# Patient Record
Sex: Female | Born: 2000
Health system: Southern US, Community
[De-identification: ages and names within clinical notes are randomized; demographics above are authoritative.]

## PROBLEM LIST (undated history)

## (undated) ENCOUNTER — Ambulatory Visit: Admission: EM | Payer: No Typology Code available for payment source | Source: Home / Self Care

## (undated) DIAGNOSIS — F329 Major depressive disorder, single episode, unspecified: Secondary | ICD-10-CM

## (undated) DIAGNOSIS — J45909 Unspecified asthma, uncomplicated: Secondary | ICD-10-CM

## (undated) DIAGNOSIS — R519 Headache, unspecified: Secondary | ICD-10-CM

## (undated) DIAGNOSIS — R51 Headache: Secondary | ICD-10-CM

## (undated) DIAGNOSIS — F32A Depression, unspecified: Secondary | ICD-10-CM

## (undated) DIAGNOSIS — L309 Dermatitis, unspecified: Secondary | ICD-10-CM

## (undated) DIAGNOSIS — F419 Anxiety disorder, unspecified: Secondary | ICD-10-CM

## (undated) DIAGNOSIS — J302 Other seasonal allergic rhinitis: Secondary | ICD-10-CM

## (undated) DIAGNOSIS — T7840XA Allergy, unspecified, initial encounter: Secondary | ICD-10-CM

## (undated) HISTORY — DX: Allergy, unspecified, initial encounter: T78.40XA

## (undated) HISTORY — DX: Depression, unspecified: F32.A

## (undated) HISTORY — PX: NO PAST SURGERIES: SHX2092

## (undated) HISTORY — DX: Major depressive disorder, single episode, unspecified: F32.9

## (undated) HISTORY — DX: Dermatitis, unspecified: L30.9

## (undated) HISTORY — DX: Headache, unspecified: R51.9

## (undated) HISTORY — DX: Anxiety disorder, unspecified: F41.9

## (undated) HISTORY — DX: Headache: R51

---
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
## 2019-01-02 MED FILL — BLISOVI FE 1/20 1-20 MG-MCG: 1-20 | 84 days supply | Qty: 112 | Fill #1

## 2019-01-17 ENCOUNTER — Telehealth: Payer: Self-pay | Admitting: Internal Medicine

## 2019-01-17 ENCOUNTER — Other Ambulatory Visit: Payer: Self-pay | Admitting: Internal Medicine

## 2019-01-17 DIAGNOSIS — F32A Depression, unspecified: Secondary | ICD-10-CM

## 2019-01-17 DIAGNOSIS — F419 Anxiety disorder, unspecified: Secondary | ICD-10-CM

## 2019-01-17 DIAGNOSIS — F329 Major depressive disorder, single episode, unspecified: Secondary | ICD-10-CM

## 2019-01-17 MED ORDER — FLUOXETINE HCL 10 MG PO CAPS: 10.0000 mg | ORAL_CAPSULE | Freq: Every day | ORAL | 3 refills | Status: DC

## 2019-01-17 MED FILL — FLUoxetine HCL 10 MG CAPS: 10 | 90 days supply | Qty: 90 | Fill #0

## 2019-01-17 NOTE — Telephone Encounter (Signed)
Change hep B vaccine 09/20/2018 to new vaccine please shes had 2 doses and does not need any further vaccines  TMS

## 2019-02-08 NOTE — Telephone Encounter (Signed)
I was able to correct this injection for patient. Heli-salv added.

## 2019-02-08 NOTE — Addendum Note (Signed)
Addended by: Nanci Pina on: 02/08/2019 04:57 PM   Modules accepted: Orders

## 2019-03-02 ENCOUNTER — Other Ambulatory Visit: Payer: Self-pay

## 2019-03-02 ENCOUNTER — Ambulatory Visit (INDEPENDENT_AMBULATORY_CARE_PROVIDER_SITE_OTHER): Payer: No Typology Code available for payment source | Admitting: Internal Medicine

## 2019-03-02 DIAGNOSIS — Z0000 Encounter for general adult medical examination without abnormal findings: Secondary | ICD-10-CM | POA: Diagnosis not present

## 2019-03-02 DIAGNOSIS — F419 Anxiety disorder, unspecified: Secondary | ICD-10-CM

## 2019-03-02 DIAGNOSIS — H01132 Eczematous dermatitis of right lower eyelid: Secondary | ICD-10-CM

## 2019-03-02 DIAGNOSIS — H01134 Eczematous dermatitis of left upper eyelid: Secondary | ICD-10-CM

## 2019-03-02 DIAGNOSIS — H01131 Eczematous dermatitis of right upper eyelid: Secondary | ICD-10-CM

## 2019-03-02 DIAGNOSIS — Z1389 Encounter for screening for other disorder: Secondary | ICD-10-CM

## 2019-03-02 DIAGNOSIS — E559 Vitamin D deficiency, unspecified: Secondary | ICD-10-CM

## 2019-03-02 DIAGNOSIS — Z0184 Encounter for antibody response examination: Secondary | ICD-10-CM

## 2019-03-02 DIAGNOSIS — Z1329 Encounter for screening for other suspected endocrine disorder: Secondary | ICD-10-CM

## 2019-03-02 DIAGNOSIS — Z1159 Encounter for screening for other viral diseases: Secondary | ICD-10-CM

## 2019-03-02 DIAGNOSIS — F321 Major depressive disorder, single episode, moderate: Secondary | ICD-10-CM

## 2019-03-02 DIAGNOSIS — L309 Dermatitis, unspecified: Secondary | ICD-10-CM

## 2019-03-02 DIAGNOSIS — H01135 Eczematous dermatitis of left lower eyelid: Secondary | ICD-10-CM

## 2019-03-02 NOTE — Progress Notes (Signed)
Virtual Visit via Video Note  I connected with Amanda Blackburn  on 03/03/19 at  4:20 PM EDT by a video enabled telemedicine application and verified that I am speaking with the correct person using two identifiers.  Location patient: home Location provider:work or home office Persons participating in the virtual visit: patient, provider  I discussed the limitations of evaluation and management by telemedicine and the availability of in person appointments. The patient expressed understanding and agreed to proceed.   HPI: 1. Anxiety and depression doing better taking prozac 10 mg consistently and let go of boyfriend and other few friends she did not feel aligned well with her or had the same goals. She has moved to her own apt and enjoying this. Also she feels she can speak with her mother more openly about life issues  2. Eczema to face and skin doing better  3. Allergic rhinitis atrovent is helping informed refills at her pharmacy  4. Annual   ROS: See pertinent positives and negatives per HPI. General: wt stable HEENT: nose sx's with allergies better can breath CV: no chest pain  Lungs: no sob  GI: no ab pain  GU: no issues MSK: no jt pain  Neuro: no h/a  Psych: improved anxiety/deression  Skin: eczema improved   Past Medical History:  Diagnosis Date  . Allergy   . Anxiety   . Asthma   . Depression    sees therapy   . Eczema   . Frequent headaches   . Seasonal allergies     Past Surgical History:  Procedure Laterality Date  . NO PAST SURGERIES      Family History  Problem Relation Age of Onset  . Diabetes Mother   . Asthma Mother   . Hypertension Father   . Asthma Sister     SOCIAL HX:  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   Single as of 03/02/2019  No guns  Wears seat belt Safe in relationship  No etoh or drugs  She wants to be a Engineer, manufacturing systems  Current Outpatient Medications:  .  albuterol (PROAIR HFA) 108 (90 Base) MCG/ACT inhaler, Inhale 2 puffs into the lungs  every 4 (four) hours as needed for wheezing or shortness of breath., Disp: 1 Inhaler, Rfl: 1 .  beclomethasone (QVAR) 40 MCG/ACT inhaler, Inhale 2 puffs into the lungs 2 (two) times daily., Disp: 1 Inhaler, Rfl: 5 .  Beclomethasone Diprop HFA (QVAR REDIHALER) 40 MCG/ACT AERB, Inhale 2 puffs into the lungs 2 (two) times daily., Disp: 1 Inhaler, Rfl: 3 .  budesonide (PULMICORT) 180 MCG/ACT inhaler, Inhale into the lungs as needed., Disp: , Rfl:  .  desonide (DESOWEN) 0.05 % cream, Apply topically 2 (two) times daily. To eyelids and under eyes as needed, Disp: 60 g, Rfl: 0 .  EPINEPHrine (EPIPEN 2-PAK) 0.3 mg/0.3 mL IJ SOAJ injection, Inject 0.3 mg into the muscle once., Disp: , Rfl:  .  FLUoxetine (PROZAC) 10 MG capsule, Take 1 capsule (10 mg total) by mouth daily., Disp: 90 capsule, Rfl: 3 .  fluticasone (FLONASE) 50 MCG/ACT nasal spray, Place 2 sprays into both nostrils daily., Disp: 16 g, Rfl: 0 .  fluticasone (FLOVENT HFA) 44 MCG/ACT inhaler, Inhale 2 puffs into the lungs 2 (two) times daily., Disp: 1 Inhaler, Rfl: 5 .  hydrocortisone 2.5 % cream, Apply topically 2 (two) times daily., Disp: 60 g, Rfl: 11 .  ibuprofen (ADVIL,MOTRIN) 400 MG tablet, Take 1 tablet (400 mg total) by mouth every 6 (six)  hours as needed., Disp: 30 tablet, Rfl: 0 .  ipratropium (ATROVENT) 0.06 % nasal spray, Place 2 sprays into both nostrils 4 (four) times daily., Disp: 15 mL, Rfl: 12 .  montelukast (SINGULAIR) 10 MG tablet, Take 1 tablet (10 mg total) by mouth at bedtime., Disp: 90 tablet, Rfl: 3 .  naproxen (NAPROSYN) 500 MG tablet, Take 1 tablet (500 mg total) by mouth 2 (two) times daily., Disp: 30 tablet, Rfl: 0 .  norethindrone-ethinyl estradiol (JUNEL FE,GILDESS FE,LOESTRIN FE) 1-20 MG-MCG tablet, Take 1 tablet by mouth daily. Only take active pills, in a continuous fashion., Disp: 3 Package, Rfl: 4 .  olopatadine (PATANOL) 0.1 % ophthalmic solution, PLACE 1 DROP INTO BOTH EYES DAILY AS NEEDED FOR ALLERGIES., Disp: 5  mL, Rfl: 0  EXAM:  VITALS per patient if applicable:  GENERAL: alert, oriented, appears well and in no acute distress  HEENT: atraumatic, conjunttiva clear, no obvious abnormalities on inspection of external nose and ears  NECK: normal movements of the head and neck  LUNGS: on inspection no signs of respiratory distress, breathing rate appears normal, no obvious gross SOB, gasping or wheezing  CV: no obvious cyanosis  MS: moves all visible extremities without noticeable abnormality  PSYCH/NEURO: pleasant and cooperative, no obvious depression or anxiety, speech and thought processing grossly intact  ASSESSMENT AND PLAN:  Discussed the following assessment and plan:  Annual physical exam Flu shot will need for 2020  Tdap had 08/18/10 will need in 10 years if not had  HPV vx had  Hep A/B vx had but updated 09/20/2018 and 09/2018 with new Hep B vaccine  -check hep B titer had 2/2 new hep B vaccine  Meningococcal vx had 01/14/18 MMR immune    Never had pap will do age 59  Disc safe sex practices on OCP See prior STD check Disc exercise to lose goal wt for now 130s given ht 5'3" rec responsible choices  No etoh or tobacco use   Labs we need to check in future 06/21/2019 CMET, CBC, TSH, T4, UA, vitamin D, hep B    Anxiety Current moderate episode of major depressive disorder without prior episode ([place]) -doing better cont meds prozac 10   Eczematous dermatitis of upper and lower eyelids of both eyes Eczema, unspecified type Doing better -disc mild skin care otc cephaphil/cerave products creams in winter for dry skin       I discussed the assessment and treatment plan with the patient. The patient was provided an opportunity to ask questions and all were answered. The patient agreed with the plan and demonstrated an understanding of the instructions.   The patient was advised to call back or seek an in-person evaluation if the symptoms worsen or if the condition fails  to improve as anticipated.  Time spent 15 minutes  Delorise Jackson, MD

## 2019-03-03 ENCOUNTER — Encounter: Payer: Self-pay | Admitting: Internal Medicine

## 2019-03-03 DIAGNOSIS — Z0000 Encounter for general adult medical examination without abnormal findings: Secondary | ICD-10-CM | POA: Insufficient documentation

## 2019-03-28 ENCOUNTER — Other Ambulatory Visit: Payer: No Typology Code available for payment source

## 2019-03-30 ENCOUNTER — Other Ambulatory Visit (HOSPITAL_COMMUNITY)
Admission: RE | Admit: 2019-03-30 | Discharge: 2019-03-30 | Disposition: A | Discharge status: Home / Self Care | Payer: No Typology Code available for payment source | Source: Ambulatory Visit | Attending: Internal Medicine | Admitting: Internal Medicine

## 2019-03-30 ENCOUNTER — Other Ambulatory Visit: Payer: Self-pay | Admitting: Internal Medicine

## 2019-03-30 ENCOUNTER — Other Ambulatory Visit: Payer: Self-pay

## 2019-03-30 ENCOUNTER — Other Ambulatory Visit (INDEPENDENT_AMBULATORY_CARE_PROVIDER_SITE_OTHER): Payer: No Typology Code available for payment source

## 2019-03-30 DIAGNOSIS — R300 Dysuria: Secondary | ICD-10-CM

## 2019-03-30 DIAGNOSIS — E559 Vitamin D deficiency, unspecified: Secondary | ICD-10-CM | POA: Diagnosis not present

## 2019-03-30 DIAGNOSIS — Z1329 Encounter for screening for other suspected endocrine disorder: Secondary | ICD-10-CM

## 2019-03-30 DIAGNOSIS — Z0184 Encounter for antibody response examination: Secondary | ICD-10-CM

## 2019-03-30 DIAGNOSIS — Z0000 Encounter for general adult medical examination without abnormal findings: Secondary | ICD-10-CM | POA: Diagnosis not present

## 2019-03-30 DIAGNOSIS — Z1389 Encounter for screening for other disorder: Secondary | ICD-10-CM

## 2019-03-31 LAB — CBC WITH DIFFERENTIAL/PLATELET
Basophils Absolute: 0 10*3/uL (ref 0.0–0.1)
Basophils Relative: 0.8 % (ref 0.0–3.0)
Eosinophils Absolute: 0.2 10*3/uL (ref 0.0–0.7)
Eosinophils Relative: 3.5 % (ref 0.0–5.0)
HCT: 40.8 % (ref 36.0–49.0)
Hemoglobin: 13.3 g/dL (ref 12.0–16.0)
Lymphocytes Relative: 35.3 % (ref 24.0–48.0)
Lymphs Abs: 2 10*3/uL (ref 0.7–4.0)
MCHC: 32.5 g/dL (ref 31.0–37.0)
MCV: 86.8 fl (ref 78.0–98.0)
Monocytes Absolute: 0.2 10*3/uL (ref 0.1–1.0)
Monocytes Relative: 4.1 % (ref 3.0–12.0)
Neutro Abs: 3.1 10*3/uL (ref 1.4–7.7)
Neutrophils Relative %: 56.3 % (ref 43.0–71.0)
Platelets: 290 10*3/uL (ref 150.0–575.0)
RBC: 4.7 Mil/uL (ref 3.80–5.70)
RDW: 12.8 % (ref 11.4–15.5)
WBC: 5.5 10*3/uL (ref 4.5–13.5)

## 2019-03-31 LAB — URINE CULTURE
MICRO NUMBER:: 944227
SPECIMEN QUALITY:: ADEQUATE

## 2019-03-31 LAB — COMPREHENSIVE METABOLIC PANEL
ALT: 11 U/L (ref 0–35)
AST: 14 U/L (ref 0–37)
Albumin: 3.9 g/dL (ref 3.5–5.2)
Alkaline Phosphatase: 52 U/L (ref 47–119)
BUN: 13 mg/dL (ref 6–23)
CO2: 26 mEq/L (ref 19–32)
Calcium: 9.3 mg/dL (ref 8.4–10.5)
Chloride: 104 mEq/L (ref 96–112)
Creatinine, Ser: 0.83 mg/dL (ref 0.40–1.20)
GFR: 106.41 mL/min (ref >60.00)
Glucose, Bld: 87 mg/dL (ref 70–99)
Potassium: 3.8 mEq/L (ref 3.5–5.1)
Sodium: 139 mEq/L (ref 135–145)
Total Bilirubin: 0.4 mg/dL (ref 0.2–1.2)
Total Protein: 7.3 g/dL (ref 6.0–8.3)

## 2019-03-31 LAB — URINALYSIS, ROUTINE W REFLEX MICROSCOPIC
Bilirubin Urine: NEGATIVE
Glucose, UA: NEGATIVE
Hgb urine dipstick: NEGATIVE
Ketones, ur: NEGATIVE
Leukocytes,Ua: NEGATIVE
Nitrite: NEGATIVE
Protein, ur: NEGATIVE
Specific Gravity, Urine: 1.025 (ref 1.001–1.03)
pH: 7 (ref 5.0–8.0)

## 2019-03-31 LAB — VITAMIN D 25 HYDROXY (VIT D DEFICIENCY, FRACTURES): VITD: 28.68 ng/mL — ABNORMAL LOW (ref 30.00–100.00)

## 2019-03-31 LAB — TSH: TSH: 0.84 u[IU]/mL (ref 0.40–5.00)

## 2019-03-31 LAB — HEPATITIS B SURFACE ANTIBODY, QUANTITATIVE: Hep B S AB Quant (Post): 361 m[IU]/mL (ref >=10)

## 2019-03-31 LAB — T4, FREE: Free T4: 0.78 ng/dL (ref 0.60–1.60)

## 2019-04-02 ENCOUNTER — Encounter: Payer: Self-pay | Admitting: Internal Medicine

## 2019-04-03 LAB — CERVICOVAGINAL ANCILLARY ONLY
Chlamydia: POSITIVE — AB
Neisseria Gonorrhea: NEGATIVE
Trichomonas: NEGATIVE

## 2019-04-04 ENCOUNTER — Encounter: Payer: Self-pay | Admitting: Internal Medicine

## 2019-04-04 ENCOUNTER — Other Ambulatory Visit: Payer: Self-pay | Admitting: Internal Medicine

## 2019-04-04 DIAGNOSIS — A749 Chlamydial infection, unspecified: Secondary | ICD-10-CM

## 2019-04-04 MED ORDER — AZITHROMYCIN 500 MG PO TABS: ORAL_TABLET | ORAL | 0 refills | Status: DC

## 2019-04-04 MED FILL — AZITHROMYCIN 500 MG TABS: 500 | 1 days supply | Qty: 2 | Fill #0

## 2019-04-05 LAB — URINE CYTOLOGY ANCILLARY ONLY
Bacterial vaginitis: POSITIVE — AB
Candida vaginitis: POSITIVE — AB

## 2019-04-06 ENCOUNTER — Other Ambulatory Visit: Payer: Self-pay | Admitting: Internal Medicine

## 2019-04-06 DIAGNOSIS — B9689 Other specified bacterial agents as the cause of diseases classified elsewhere: Secondary | ICD-10-CM

## 2019-04-06 DIAGNOSIS — B3731 Acute candidiasis of vulva and vagina: Secondary | ICD-10-CM

## 2019-04-06 DIAGNOSIS — B373 Candidiasis of vulva and vagina: Secondary | ICD-10-CM

## 2019-04-06 MED ORDER — METRONIDAZOLE 500 MG PO TABS: 500.0000 mg | ORAL_TABLET | Freq: Two times a day (BID) | ORAL | 0 refills | Status: DC

## 2019-04-06 MED ORDER — FLUCONAZOLE 150 MG PO TABS: 150.0000 mg | ORAL_TABLET | Freq: Once | ORAL | 0 refills | Status: AC

## 2019-04-06 MED FILL — METRONIDAZOLE 500 MG TABS: 500 | 7 days supply | Qty: 14 | Fill #0

## 2019-04-06 MED FILL — FLUCONAZOLE 150 MG TABS: 150 | 1 days supply | Qty: 1 | Fill #0

## 2019-04-13 ENCOUNTER — Telehealth: Payer: Self-pay | Admitting: *Deleted

## 2019-04-13 NOTE — Telephone Encounter (Signed)
I Have completed the proper form for reporting a Communicable disease to the [facility].

## 2019-04-13 NOTE — Telephone Encounter (Signed)
-----   Message from Delorise Jackson, MD sent at 04/04/2019  7:17 PM EDT ----- +chlamydia   Juliann Pulse please show brock how to call health dept if this is needed its reportable   Thanks Brewer

## 2019-04-17 MED FILL — MONTELUKAST SOD 10 MG TAB: 10 | 90 days supply | Qty: 90 | Fill #1

## 2019-04-17 MED FILL — FLUoxetine HCL 10 MG CAPS: 10 | 90 days supply | Qty: 90 | Fill #1

## 2019-04-20 NOTE — Progress Notes (Signed)
[address] [place] [address] Dept: [phone]  FOLLOW UP NOTE  Patient ID: Amanda Blackburn, female    DOB: 2000-08-06  Age: 18 y.o. MRN: [id number] Date of Office Visit: 04/21/2019  Assessment  Chief Complaint: Asthma and Allergic Rhinitis   HPI Amanda Blackburn is a 18 year old female sho presents to the clinic for a follow up visit. She was last seen in this clinic on 05/29/2016 by Dr. Delorse Lek for evaluation of asthma, allergic rhinitis,and allergic conjunctivitis. She reports her asthma has been well controlled with no shortness of breath or cough with activity or rest.  She does report wheezing when laughing.  She has previously taken montelukast, however she is currently out of this medication.  She reports using her ProAir about once a month with relief of symptoms.  Allergic rhinitis is reported is not well controlled with clear rhinorrhea and sneezing which occurs year-round with this worsening in the spring.  She denies nasal congestion and postnasal drainage.  She continues Flonase as needed and Xyzol almost every day with no relief of symptoms.  She is not currently using nasal saline rinses.  Allergic conjunctivitis is reported as well controlled with no current medical intervention.  She reports that her mouth itches when she eats fresh apples, kiwis, and peaches.  She denies concomitant cardiopulmonary, angioedema, or gastrointestinal symptoms with the itchy mouth on ingestion of raw fruits.  Her current medications are listed in the chart.   Drug Allergies:  Allergies  Allergen Reactions  . Nickel Rash    Rash   . Sulfa Antibiotics Rash    Rash     Physical Exam: BP 98/70 (BP Location: Left Arm, Patient Position: Sitting, Cuff Size: Normal)   Pulse 88   Temp 97.9 F (36.6 C) (Temporal)   Resp 16   Ht 5' 3.5" (1.613 m)   Wt 153 lb 12.8 oz (69.8 kg)   SpO2 97%   BMI 26.82 kg/m    Physical Exam Vitals signs reviewed.  Constitutional:      Appearance:  Normal appearance.  HENT:     Head: Normocephalic and atraumatic.     Right Ear: Tympanic membrane normal.     Left Ear: Tympanic membrane normal.     Nose:     Comments: Bilateral nares slightly erythematous with clear nasal drainage noted.  Pharynx slightly erythematous with no exudate noted.  Ears normal.  Eyes normal. Eyes:     Conjunctiva/sclera: Conjunctivae normal.  Neck:     Musculoskeletal: Normal range of motion and neck supple.  Cardiovascular:     Rate and Rhythm: Normal rate and regular rhythm.     Heart sounds: Normal heart sounds. No murmur.  Pulmonary:     Effort: Pulmonary effort is normal.     Breath sounds: Normal breath sounds.     Comments: Lungs clear to auscultation Musculoskeletal: Normal range of motion.  Skin:    General: Skin is warm and dry.  Neurological:     Mental Status: She is alert and oriented to person, place, and time.  Psychiatric:        Mood and Affect: Mood normal.        Behavior: Behavior normal.        Thought Content: Thought content normal.        Judgment: Judgment normal.     Diagnostics: FVC 3.45, FEV1 2.61.  Predicted FVC 3.19.  Predicted FEV1 2.84.  Spirometry indicates normal ventilatory function.  Percutaneous environmental skin  testing positive to grass pollen, weed pollen, and tree pollen with adequate controls.  Intradermal skin testing positive to ragweed pollen, mold mix 2, cat hair, and dust mite with adequate controls.    Assessment and Plan: 1. Mild intermittent asthma without complication   2. Seasonal and perennial allergic rhinitis   3. Pollen-food allergy, subsequent encounter     Meds ordered this encounter  Medications  . EPINEPHrine (AUVI-Q) 0.3 mg/0.3 mL IJ SOAJ injection    Sig: Inject 0.3 mLs (0.3 mg total) into the muscle once for 1 dose. As directed for life-threatening allergic reactions    Dispense:  2 each    Refill:  2  . EPINEPHrine (EPIPEN 2-PAK) 0.3 mg/0.3 mL IJ SOAJ injection    Sig:  Inject 0.3 mLs (0.3 mg total) into the muscle once for 1 dose.    Dispense:  0.3 mL    Refill:  0    Patient Instructions  Asthma Begin montelukast 10 mg once a day to prevent cough and wheeze Continue albuterol 2 puffs every 4 hours as needed for cough or wheeze Asthma control goals:   Full participation in all desired activities (may need albuterol before activity)  Albuterol use two time or less a week on average (not counting use with activity)  Cough interfering with sleep two time or less a month  Oral steroids no more than once a year  No hospitalizations   Allergic rhinitis You environmental skin testing was positive to grass pollen, weed, pollen, tree pollen, rag weed pollen, mold, dust mite, and cat hair Stop Xyzal and begin RyVent 6 mg twice a day Continue Flonase 1-2 sprays in each nostril once a day as needed for a stuffy nose Consider allergen immunotherapy if yourr symptoms are not well controlled with the above treatment plan. If you  Oral allergy syndrome Continue to avoid the foods that make your mouth itch The oral allergy syndrome (OAS) or pollen-food allergy syndrome (PFAS) is a relatively common form of food allergy, particularly in adults. It typically occurs in people who have pollen allergies when the immune system "sees" proteins on the food that look like proteins on the pollen. This results in the allergy antibody (IgE) binding to the food instead of the pollen. Patients typically report itching and/or mild swelling of the mouth and throat immediately following ingestion of certain uncooked fruits (including nuts) or raw vegetables. Only a very small number of affected individuals experience systemic allergic reactions, such as anaphylaxis which occurs with true food allergies.     Call the clinic if this treatment plan is not working well for you  Follow up in 6 weeks or sooner if needed   Return in about 6 weeks (around 06/02/2019).    Thank you  for the opportunity to care for this patient.  Please do not hesitate to contact me with questions.  Gareth Morgan, FNP [facility]

## 2019-04-21 ENCOUNTER — Other Ambulatory Visit: Payer: Self-pay

## 2019-04-21 ENCOUNTER — Ambulatory Visit (INDEPENDENT_AMBULATORY_CARE_PROVIDER_SITE_OTHER): Payer: No Typology Code available for payment source | Admitting: Family Medicine

## 2019-04-21 ENCOUNTER — Encounter: Payer: Self-pay | Admitting: Family Medicine

## 2019-04-21 VITALS — BP 98/70 | HR 88 | Temp 97.9°F | Resp 16 | Ht 63.5 in | Wt 153.8 lb

## 2019-04-21 DIAGNOSIS — T781XXA Other adverse food reactions, not elsewhere classified, initial encounter: Secondary | ICD-10-CM | POA: Insufficient documentation

## 2019-04-21 DIAGNOSIS — J302 Other seasonal allergic rhinitis: Secondary | ICD-10-CM | POA: Insufficient documentation

## 2019-04-21 DIAGNOSIS — J4520 Mild intermittent asthma, uncomplicated: Secondary | ICD-10-CM

## 2019-04-21 DIAGNOSIS — J3089 Other allergic rhinitis: Secondary | ICD-10-CM

## 2019-04-21 DIAGNOSIS — T781XXD Other adverse food reactions, not elsewhere classified, subsequent encounter: Secondary | ICD-10-CM | POA: Diagnosis not present

## 2019-04-21 MED ORDER — EPINEPHRINE 0.3 MG/0.3ML IJ SOAJ: 0.3000 mg | Freq: Once | INTRAMUSCULAR | 2 refills | Status: AC

## 2019-04-21 MED ORDER — CARBINOXAMINE MALEATE 6 MG PO TABS: 1.0000 | ORAL_TABLET | Freq: Three times a day (TID) | ORAL | 5 refills | Status: DC | PRN

## 2019-04-21 MED ORDER — EPINEPHRINE 0.3 MG/0.3ML IJ SOAJ: 0.3000 mg | Freq: Once | INTRAMUSCULAR | 0 refills | Status: AC

## 2019-04-21 NOTE — Patient Instructions (Addendum)
Asthma Begin montelukast 10 mg once a day to prevent cough and wheeze Continue albuterol 2 puffs every 4 hours as needed for cough or wheeze Asthma control goals:   Full participation in all desired activities (may need albuterol before activity)  Albuterol use two time or less a week on average (not counting use with activity)  Cough interfering with sleep two time or less a month  Oral steroids no more than once a year  No hospitalizations   Allergic rhinitis You environmental skin testing was positive to grass pollen, weed, pollen, tree pollen, rag weed pollen, mold, dust mite, and cat hair Stop Xyzal and begin RyVent 6 mg twice a day Continue Flonase 1-2 sprays in each nostril once a day as needed for a stuffy nose Consider allergen immunotherapy if yourr symptoms are not well controlled with the above treatment plan. Make an appointment for your first allergy injection for 2 weeks from now  Oral allergy syndrome Continue to avoid the foods that make your mouth itch The oral allergy syndrome (OAS) or pollen-food allergy syndrome (PFAS) is a relatively common form of food allergy, particularly in adults. It typically occurs in people who have pollen allergies when the immune system "sees" proteins on the food that look like proteins on the pollen. This results in the allergy antibody (IgE) binding to the food instead of the pollen. Patients typically report itching and/or mild swelling of the mouth and throat immediately following ingestion of certain uncooked fruits (including nuts) or raw vegetables. Only a very small number of affected individuals experience systemic allergic reactions, such as anaphylaxis which occurs with true food allergies.      Call the clinic if this treatment plan is not working well for you  Follow up in 6 weeks or sooner if needed  Reducing Pollen Exposure The American Academy of Allergy, Asthma and Immunology suggests the following steps to reduce  your exposure to pollen during allergy seasons. 1. Do not hang sheets or clothing out to dry; pollen may collect on these items. 2. Do not mow lawns or spend time around freshly cut grass; mowing stirs up pollen. 3. Keep windows closed at night.  Keep car windows closed while driving. 4. Minimize morning activities outdoors, a time when pollen counts are usually at their highest. 5. Stay indoors as much as possible when pollen counts or humidity is high and on windy days when pollen tends to remain in the air longer. 6. Use air conditioning when possible.  Many air conditioners have filters that trap the pollen spores. 7. Use a HEPA room air filter to remove pollen form the indoor air you breathe.  Control of Mold Allergen Mold and fungi can grow on a variety of surfaces provided certain temperature and moisture conditions exist.  Outdoor molds grow on plants, decaying vegetation and soil.  The major outdoor mold, Alternaria and Cladosporium, are found in very high numbers during hot and dry conditions.  Generally, a late Summer - Fall peak is seen for common outdoor fungal spores.  Rain will temporarily lower outdoor mold spore count, but counts rise rapidly when the rainy period ends.  The most important indoor molds are Aspergillus and Penicillium.  Dark, humid and poorly ventilated basements are ideal sites for mold growth.  The next most common sites of mold growth are the bathroom and the kitchen.  Outdoor [employer] 8. Use air conditioning and keep windows closed 9. Avoid exposure to decaying vegetation. 10. Avoid leaf raking. 11.  Avoid grain handling. 12. Consider wearing a face mask if working in moldy areas.  Indoor Mold Control 1. Maintain humidity below 50%. 2. Clean washable surfaces with 5% bleach solution. 3. Remove sources e.g. Contaminated carpets.  Control of House Dust Mite Allergen House dust mites play a major role in allergic asthma and rhinitis.  They occur in  environments with high humidity wherever human skin, the food for dust mites is found. High levels have been detected in dust obtained from mattresses, pillows, carpets, upholstered furniture, bed covers, clothes and soft toys.  The principal allergen of the house dust mite is found in its feces.  A gram of dust may contain 1,000 mites and 250,000 fecal particles.  Mite antigen is easily measured in the air during house cleaning activities.    1. Encase mattresses, including the box spring, and pillow, in an air tight cover.  Seal the zipper end of the encased mattresses with wide adhesive tape. 2. Wash the bedding in water of 130 degrees Farenheit weekly.  Avoid cotton comforters/quilts and flannel bedding: the most ideal bed covering is the dacron comforter. 3. Remove all upholstered furniture from the bedroom. 4. Remove carpets, carpet padding, rugs, and non-washable window drapes from the bedroom.  Wash drapes weekly or use plastic window coverings. 5. Remove all non-washable stuffed toys from the bedroom.  Wash stuffed toys weekly. 6. Have the room cleaned frequently with a vacuum cleaner and a damp dust-mop.  The patient should not be in a room which is being cleaned and should wait 1 hour after cleaning before going into the room. 7. Close and seal all heating outlets in the bedroom.  Otherwise, the room will become filled with dust-laden air.  An electric heater can be used to heat the room. 8. Reduce indoor humidity to less than 50%.  Do not use a humidifier.   Control of Dog or Cat Allergen Avoidance is the best way to manage a dog or cat allergy. If you have a dog or cat and are allergic to dog or cats, consider removing the dog or cat from the home. If you have a dog or cat but don't want to find it a new home, or if your family wants a pet even though someone in the household is allergic, here are some strategies that may help keep symptoms at bay:  13. Keep the pet out of your bedroom  and restrict it to only a few rooms. Be advised that keeping the dog or cat in only one room will not limit the allergens to that room. 14. Don't pet, hug or kiss the dog or cat; if you do, wash your hands with soap and water. 15. High-efficiency particulate air (HEPA) cleaners run continuously in a bedroom or living room can reduce allergen levels over time. 16. Regular use of a high-efficiency vacuum cleaner or a central vacuum can reduce allergen levels. 17. Giving your dog or cat a bath at least once a week can reduce airborne allergen.

## 2019-04-21 NOTE — Addendum Note (Signed)
Addended by: Valere Dross on: 04/21/2019 04:26 PM   Modules accepted: Orders

## 2019-04-24 ENCOUNTER — Telehealth: Payer: Self-pay | Admitting: Family Medicine

## 2019-04-24 DIAGNOSIS — J069 Acute upper respiratory infection, unspecified: Secondary | ICD-10-CM

## 2019-04-24 DIAGNOSIS — T7840XD Allergy, unspecified, subsequent encounter: Secondary | ICD-10-CM

## 2019-04-24 MED ORDER — CARBINOXAMINE MALEATE 6 MG PO TABS: 1.0000 | ORAL_TABLET | Freq: Two times a day (BID) | ORAL | 5 refills | Status: DC

## 2019-04-24 MED ORDER — EPINEPHRINE 0.3 MG/0.3ML IJ SOAJ: 0.3000 mg | INTRAMUSCULAR | 1 refills | Status: DC | PRN

## 2019-04-24 MED ORDER — FLUTICASONE PROPIONATE 50 MCG/ACT NA SUSP: 2.0000 | Freq: Every day | NASAL | 5 refills | Status: DC

## 2019-04-24 MED ORDER — MONTELUKAST SODIUM 10 MG PO TABS: 10.0000 mg | ORAL_TABLET | Freq: Every day | ORAL | 5 refills | Status: DC

## 2019-04-24 MED FILL — FLUTICASONE PROP 50 MCG SPR: 50 | 30 days supply | Qty: 16 | Fill #0

## 2019-04-24 MED FILL — EPINEPHRINE 0.3 MG AUTO-INJ: 0.3 | 30 days supply | Qty: 2 | Fill #0

## 2019-04-24 NOTE — Telephone Encounter (Signed)
Prescriptions have been sent to the correct pharmacy. I have informed mom. I did let her know that the Ryvent may not be covered by the insurance so this may be why that one never showed as filled at the pharmacy. I let her know that we would have to see if [facility] could order the Auvi-Q and if not we would probably have to switch to the regular epinephrine device.

## 2019-04-24 NOTE — Telephone Encounter (Signed)
Patient was seen Friday, 04/21/19. Mom called to say the Epi Pen (she never picked up) was sent to the wrong pharmacy and they never received the Ryvent and she didn't know if there were any other medications that were to be sent in. She needs the Epi Pen, Ryvent and anything else sent. She needs every medication, to go to [facility].

## 2019-04-25 NOTE — Telephone Encounter (Signed)
PA for Ryvent 6 mg was initiated through [email or web address] pending approval.

## 2019-04-26 NOTE — Telephone Encounter (Signed)
PA still pending.  

## 2019-04-27 ENCOUNTER — Ambulatory Visit: Payer: Self-pay | Admitting: Allergy

## 2019-04-27 MED ORDER — CARBINOXAMINE MALEATE 4 MG PO TABS: ORAL_TABLET | ORAL | 5 refills | Status: DC

## 2019-04-27 NOTE — Telephone Encounter (Signed)
Can you please send carbinoxamine 4 mg tablets-take 1-2 tablets up to three times a day as needed for nasal symptoms. Thank you

## 2019-04-27 NOTE — Telephone Encounter (Signed)
Call to notify patient of the change of medication. Pt verbalized understanding.  Call ended.

## 2019-04-27 NOTE — Telephone Encounter (Signed)
Fax from [employer]:  Ryvent has been denied Alternative given:  Carbinoxamine 4 mg tablets  Please advise:

## 2019-04-27 NOTE — Addendum Note (Signed)
Addended by: Neomia Dear on: 04/27/2019 06:18 PM   Modules accepted: Orders

## 2019-04-28 MED FILL — CARBINOXAMINE MALEATE 4 MG: 4 | 15 days supply | Qty: 90 | Fill #0

## 2019-05-19 ENCOUNTER — Encounter: Payer: Self-pay | Admitting: Internal Medicine

## 2019-05-22 ENCOUNTER — Telehealth: Payer: Self-pay | Admitting: Radiology

## 2019-05-22 NOTE — Telephone Encounter (Signed)
Called patient and spoke with her about scheduling appointment with Dr Harolyn Rutherford for a concern about her cycle lasting 2 weeks. Patient stated that after she called her cycle stopped and that she no longer had a concern. Stated that she would call [facility] to schedule appointment if needed, but at this time he does not need an appointment.

## 2019-05-30 ENCOUNTER — Encounter: Payer: Self-pay | Admitting: Internal Medicine

## 2019-06-12 MED FILL — HYDROCORTISONE 2.5% CREAM: 2.5 | 30 days supply | Qty: 60 | Fill #1

## 2019-06-19 MED FILL — BLISOVI FE 1/20 1-20 MG-MCG: 1-20 | 63 days supply | Qty: 84 | Fill #3

## 2019-06-21 ENCOUNTER — Other Ambulatory Visit: Payer: No Typology Code available for payment source

## 2019-06-28 ENCOUNTER — Encounter: Payer: Self-pay | Admitting: Internal Medicine

## 2019-06-28 ENCOUNTER — Other Ambulatory Visit: Payer: Self-pay | Admitting: Internal Medicine

## 2019-06-28 DIAGNOSIS — E611 Iron deficiency: Secondary | ICD-10-CM

## 2019-06-28 DIAGNOSIS — Z113 Encounter for screening for infections with a predominantly sexual mode of transmission: Secondary | ICD-10-CM

## 2019-06-28 DIAGNOSIS — Z8619 Personal history of other infectious and parasitic diseases: Secondary | ICD-10-CM

## 2019-07-04 ENCOUNTER — Other Ambulatory Visit: Payer: Self-pay

## 2019-07-05 ENCOUNTER — Other Ambulatory Visit (INDEPENDENT_AMBULATORY_CARE_PROVIDER_SITE_OTHER): Payer: No Typology Code available for payment source

## 2019-07-05 ENCOUNTER — Other Ambulatory Visit (HOSPITAL_COMMUNITY)
Admission: RE | Admit: 2019-07-05 | Discharge: 2019-07-05 | Disposition: A | Discharge status: Home / Self Care | Payer: No Typology Code available for payment source | Source: Ambulatory Visit | Attending: Internal Medicine | Admitting: Internal Medicine

## 2019-07-05 DIAGNOSIS — Z8619 Personal history of other infectious and parasitic diseases: Secondary | ICD-10-CM | POA: Diagnosis present

## 2019-07-05 DIAGNOSIS — E611 Iron deficiency: Secondary | ICD-10-CM | POA: Diagnosis not present

## 2019-07-05 DIAGNOSIS — Z113 Encounter for screening for infections with a predominantly sexual mode of transmission: Secondary | ICD-10-CM | POA: Insufficient documentation

## 2019-07-05 NOTE — Addendum Note (Signed)
Addended by: Warden Fillers on: 07/05/2019 03:50 PM   Modules accepted: Orders

## 2019-07-06 LAB — IRON,TIBC AND FERRITIN PANEL
Ferritin: 31 ng/mL (ref 15–77)
Iron Saturation: 38 % (ref 15–55)
Iron: 127 ug/dL (ref 27–159)
Total Iron Binding Capacity: 334 ug/dL (ref 250–450)
UIBC: 207 ug/dL (ref 131–425)

## 2019-07-11 ENCOUNTER — Encounter: Payer: Self-pay | Admitting: Internal Medicine

## 2019-07-11 ENCOUNTER — Other Ambulatory Visit: Payer: Self-pay | Admitting: Internal Medicine

## 2019-07-11 DIAGNOSIS — B9689 Other specified bacterial agents as the cause of diseases classified elsewhere: Secondary | ICD-10-CM

## 2019-07-11 LAB — URINE CYTOLOGY ANCILLARY ONLY
Bacterial Vaginitis-Urine: POSITIVE — AB
Bacterial Vaginitis-Urine: POSITIVE — AB
Candida Urine: NEGATIVE
Chlamydia: NEGATIVE
Comment: NEGATIVE
Comment: NEGATIVE
Comment: NORMAL
Neisseria Gonorrhea: NEGATIVE
Trichomonas: NEGATIVE

## 2019-07-11 MED ORDER — METRONIDAZOLE 500 MG PO TABS: 500.0000 mg | ORAL_TABLET | Freq: Two times a day (BID) | ORAL | 0 refills | Status: DC

## 2019-07-11 MED FILL — METRONIDAZOLE 500 MG TABS: 500 | 7 days supply | Qty: 14 | Fill #0

## 2019-07-11 NOTE — Patient Instructions (Signed)
Bacterial Vaginosis  Bacterial vaginosis is a vaginal infection that occurs when the normal balance of bacteria in the vagina is disrupted. It results from an overgrowth of certain bacteria. This is the most common vaginal infection among women ages 15-44. Because bacterial vaginosis increases your risk for STIs (sexually transmitted infections), getting treated can help reduce your risk for chlamydia, gonorrhea, herpes, and HIV (human immunodeficiency virus). Treatment is also important for preventing complications in pregnant women, because this condition can cause an early (premature) delivery. What are the causes? This condition is caused by an increase in harmful bacteria that are normally present in small amounts in the vagina. However, the reason that the condition develops is not fully understood. What increases the risk? The following factors may make you more likely to develop this condition:  Having a new sexual partner or multiple sexual partners.  Having unprotected sex.  Douching.  Having an intrauterine device (IUD).  Smoking.  Drug and alcohol abuse.  Taking certain antibiotic medicines.  Being pregnant. You cannot get bacterial vaginosis from toilet seats, bedding, swimming pools, or contact with objects around you. What are the signs or symptoms? Symptoms of this condition include:  Grey or white vaginal discharge. The discharge can also be watery or foamy.  A fish-like odor with discharge, especially after sexual intercourse or during menstruation.  Itching in and around the vagina.  Burning or pain with urination. Some women with bacterial vaginosis have no signs or symptoms. How is this diagnosed? This condition is diagnosed based on:  Your medical history.  A physical exam of the vagina.  Testing a sample of vaginal fluid under a microscope to look for a large amount of bad bacteria or abnormal cells. Your health care provider may use a cotton swab or  a small wooden spatula to collect the sample. How is this treated? This condition is treated with antibiotics. These may be given as a pill, a vaginal cream, or a medicine that is put into the vagina (suppository). If the condition comes back after treatment, a second round of antibiotics may be needed. Follow these instructions at home: Medicines  Take over-the-counter and prescription medicines only as told by your health care provider.  Take or use your antibiotic as told by your health care provider. Do not stop taking or using the antibiotic even if you start to feel better. General instructions  If you have a female sexual partner, tell her that you have a vaginal infection. She should see her health care provider and be treated if she has symptoms. If you have a female sexual partner, he does not need treatment.  During treatment: ? Avoid sexual activity until you finish treatment. ? Do not douche. ? Avoid alcohol as directed by your health care provider. ? Avoid breastfeeding as directed by your health care provider.  Drink enough water and fluids to keep your urine clear or pale yellow.  Keep the area around your vagina and rectum clean. ? Wash the area daily with warm water. ? Wipe yourself from front to back after using the toilet.  Keep all follow-up visits as told by your health care provider. This is important. How is this prevented?  Do not douche.  Wash the outside of your vagina with warm water only.  Use protection when having sex. This includes latex condoms and dental dams.  Limit how many sexual partners you have. To help prevent bacterial vaginosis, it is best to have sex with just one partner (  monogamous).  Make sure you and your sexual partner are tested for STIs.  Wear cotton or cotton-lined underwear.  Avoid wearing tight pants and pantyhose, especially during summer.  Limit the amount of alcohol that you drink.  Do not use any products that contain  nicotine or tobacco, such as cigarettes and e-cigarettes. If you need help quitting, ask your health care provider.  Do not use illegal drugs. Where to find more information  Centers for Disease Control and Prevention: www.cdc.gov/std  American Sexual Health Association (ASHA): www.ashastd.org  [place] Department of Health and Human Services, Office on Women's Health: www.womenshealth.gov/ or https://www.womenshealth.gov/a-z-topics/bacterial-vaginosis Contact a health care provider if:  Your symptoms do not improve, even after treatment.  You have more discharge or pain when urinating.  You have a fever.  You have pain in your abdomen.  You have pain during sex.  You have vaginal bleeding between periods. Summary  Bacterial vaginosis is a vaginal infection that occurs when the normal balance of bacteria in the vagina is disrupted.  Because bacterial vaginosis increases your risk for STIs (sexually transmitted infections), getting treated can help reduce your risk for chlamydia, gonorrhea, herpes, and HIV (human immunodeficiency virus). Treatment is also important for preventing complications in pregnant women, because the condition can cause an early (premature) delivery.  This condition is treated with antibiotic medicines. These may be given as a pill, a vaginal cream, or a medicine that is put into the vagina (suppository). This information is not intended to replace advice given to you by your health care provider. Make sure you discuss any questions you have with your health care provider. Document Revised: 05/28/2017 Document Reviewed: 02/29/2016 Elsevier Patient Education  2020 [employer]  

## 2019-08-07 ENCOUNTER — Other Ambulatory Visit: Payer: Self-pay | Admitting: *Deleted

## 2019-08-07 DIAGNOSIS — Z20822 Contact with and (suspected) exposure to covid-19: Secondary | ICD-10-CM

## 2019-08-08 LAB — NOVEL CORONAVIRUS, NAA: SARS-CoV-2, NAA: NOT DETECTED

## 2019-09-18 ENCOUNTER — Other Ambulatory Visit: Payer: Self-pay | Admitting: Obstetrics & Gynecology

## 2019-09-18 DIAGNOSIS — Z3041 Encounter for surveillance of contraceptive pills: Secondary | ICD-10-CM

## 2019-09-19 MED FILL — BLISOVI FE 1/20 1-20 MG-MCG: 1-20 | 84 days supply | Qty: 112 | Fill #0

## 2019-09-24 ENCOUNTER — Ambulatory Visit: Payer: No Typology Code available for payment source

## 2019-09-26 ENCOUNTER — Other Ambulatory Visit: Payer: Self-pay | Admitting: *Deleted

## 2019-09-26 DIAGNOSIS — Z3041 Encounter for surveillance of contraceptive pills: Secondary | ICD-10-CM

## 2019-09-26 NOTE — Telephone Encounter (Signed)
Erroneous

## 2019-10-05 ENCOUNTER — Ambulatory Visit: Payer: No Typology Code available for payment source | Attending: Internal Medicine

## 2019-10-05 DIAGNOSIS — Z23 Encounter for immunization: Secondary | ICD-10-CM

## 2019-10-05 NOTE — Progress Notes (Signed)
   Covid-19 Vaccination Clinic  Name:  Amanda Blackburn    MRN: [id number] DOB: [birth year]  10/05/2019  [name] was observed post Covid-19 immunization for 15 minutes without incident. She was provided with Vaccine Information Sheet and instruction to access the V-Safe system.   Ms. Blades was instructed to call 911 with any severe reactions post vaccine: Marland Kitchen Difficulty breathing  . Swelling of face and throat  . A fast heartbeat  . A bad rash all over body  . Dizziness and weakness   Immunizations Administered    Name Date Dose VIS Date Route   Pfizer COVID-19 Vaccine 10/05/2019  4:26 PM 0.3 mL 06/09/2019 Intramuscular   Manufacturer: [employer], [employer]   Lot: [id number]   NDC: [address]-5

## 2019-10-08 NOTE — Progress Notes (Deleted)
RE: Amanda Blackburn MRN: [id number] DOB: 07-Jul-2000 Date of Telemedicine Visit: 10/09/2019  Referring provider: Quentin Ore * Primary care provider: McLean-Scocuzza, Pasty Spillers, MD  Chief Complaint: No chief complaint on file.   Telemedicine Follow Up Visit via Telephone: I connected with Amanda Blackburn for a follow up on 10/08/19 by telephone and verified that I am speaking with the correct person using two identifiers.   I discussed the limitations, risks, security and privacy concerns of performing an evaluation and management service by telephone and the availability of in person appointments. I also discussed with the patient that there may be a patient responsible charge related to this service. The patient expressed understanding and agreed to proceed.  Patient is at *** accompanied by *** who provided/contributed to the history.  Provider is at the office.  Visit start time: *** Visit end time: *** Insurance consent/check in by: *** Medical consent and medical assistant/nurse: ***  History of Present Illness: She is a 19 y.o. female, who is being followed for asthma, allergic rhiniits, allergic conjunctivitis, and oral allergy syndrome. Her previous allergy office visit was on 04/21/2019 with Thermon Leyland, FNP.   ***  Assessment and Plan: [name] is a 19 y.o. female with: There are no Patient Instructions on file for this visit.  No follow-ups on file.  No orders of the defined types were placed in this encounter.  Lab Orders  No laboratory test(s) ordered today    Diagnostics: None.  Medication List:  Current Outpatient Medications  Medication Sig Dispense Refill  . albuterol (PROAIR HFA) 108 (90 Base) MCG/ACT inhaler Inhale 2 puffs into the lungs every 4 (four) hours as needed for wheezing or shortness of breath. 1 Inhaler 1  . Beclomethasone Diprop HFA (QVAR REDIHALER) 40 MCG/ACT AERB Inhale 2 puffs into the lungs 2 (two) times daily. (Patient not taking:  Reported on 04/21/2019) 1 Inhaler 3  . BLISOVI FE 1/20 1-20 MG-MCG tablet TAKE 1 TABLET BY MOUTH ONCE DAILY (ONLY TAKE ACTIVE TABLETS, IN A CONTINUOUS FASHION) 84 tablet 4  . Carbinoxamine Maleate (RYVENT) 6 MG TABS Take 1 tablet by mouth 2 (two) times daily. 60 tablet 5  . Carbinoxamine Maleate 4 MG TABS 1-2 tablets up to three times per day as needed for nasal symptoms. 90 tablet 5  . desonide (DESOWEN) 0.05 % cream Apply topically 2 (two) times daily. To eyelids and under eyes as needed 60 g 0  . EPINEPHrine (AUVI-Q) 0.3 mg/0.3 mL IJ SOAJ injection Inject 0.3 mLs (0.3 mg total) into the muscle as needed for anaphylaxis. 2 each 1  . FLUoxetine (PROZAC) 10 MG capsule Take 1 capsule (10 mg total) by mouth daily. 90 capsule 3  . fluticasone (FLONASE) 50 MCG/ACT nasal spray Place 2 sprays into both nostrils daily. 16 g 5  . hydrocortisone 2.5 % cream Apply topically 2 (two) times daily. 60 g 11  . ibuprofen (ADVIL,MOTRIN) 400 MG tablet Take 1 tablet (400 mg total) by mouth every 6 (six) hours as needed. 30 tablet 0  . ipratropium (ATROVENT) 0.06 % nasal spray Place 2 sprays into both nostrils 4 (four) times daily. 15 mL 12  . metroNIDAZOLE (FLAGYL) 500 MG tablet Take 1 tablet (500 mg total) by mouth 2 (two) times daily. With food 14 tablet 0  . montelukast (SINGULAIR) 10 MG tablet Take 1 tablet (10 mg total) by mouth at bedtime. 30 tablet 5  . olopatadine (PATANOL) 0.1 % ophthalmic solution PLACE 1 DROP INTO BOTH EYES DAILY AS  NEEDED FOR ALLERGIES. 5 mL 0   No current facility-administered medications for this visit.   Allergies: Allergies  Allergen Reactions  . Nickel Rash    Rash   . Sulfa Antibiotics Rash    Rash    I reviewed her past medical history, social history, family history, and environmental history and no significant changes have been reported from previous visit on ***.  Review of Systems Objective: Physical Exam Not obtained as encounter was done via telephone.    Previous notes and tests were reviewed.  I discussed the assessment and treatment plan with the patient. The patient was provided an opportunity to ask questions and all were answered. The patient agreed with the plan and demonstrated an understanding of the instructions.   The patient was advised to call back or seek an in-person evaluation if the symptoms worsen or if the condition fails to improve as anticipated.  I provided *** minutes of non-face-to-face time during this encounter.  It was my pleasure to participate in [place] care today. Please feel free to contact me with any questions or concerns.   Sincerely,  Gareth Morgan, FNP

## 2019-10-09 ENCOUNTER — Ambulatory Visit: Payer: No Typology Code available for payment source | Admitting: Family Medicine

## 2019-10-09 MED FILL — BLISOVI FE 1/20 1-20 MG-MCG: 1-20 | 84 days supply | Qty: 112 | Fill #0

## 2019-10-10 ENCOUNTER — Other Ambulatory Visit: Payer: Self-pay | Admitting: Internal Medicine

## 2019-10-10 ENCOUNTER — Encounter: Payer: Self-pay | Admitting: Internal Medicine

## 2019-10-10 DIAGNOSIS — Z20822 Contact with and (suspected) exposure to covid-19: Secondary | ICD-10-CM

## 2019-10-11 NOTE — Telephone Encounter (Signed)
Called and scheduled patient for 4/16 at 1:00 pm.

## 2019-10-11 NOTE — Telephone Encounter (Signed)
Added to previous encounter  

## 2019-10-11 NOTE — Telephone Encounter (Signed)
Amanda Blackburn  Amanda Blackburn, Amanda Spillers, MD 14 hours ago (5:37 PM)  CG i also think i need to be great for a yeast infection , i'm not really sure

## 2019-10-11 NOTE — Telephone Encounter (Signed)
Pt would like a call back about scheduling a UA. There was not an order placed so I did not schedule.

## 2019-10-13 ENCOUNTER — Ambulatory Visit: Payer: No Typology Code available for payment source | Admitting: Internal Medicine

## 2019-10-16 ENCOUNTER — Encounter: Payer: Self-pay | Admitting: Family Medicine

## 2019-10-16 ENCOUNTER — Ambulatory Visit (INDEPENDENT_AMBULATORY_CARE_PROVIDER_SITE_OTHER): Payer: No Typology Code available for payment source | Admitting: Family Medicine

## 2019-10-16 ENCOUNTER — Telehealth: Payer: Self-pay

## 2019-10-16 ENCOUNTER — Other Ambulatory Visit: Payer: Self-pay

## 2019-10-16 DIAGNOSIS — H1010 Acute atopic conjunctivitis, unspecified eye: Secondary | ICD-10-CM

## 2019-10-16 DIAGNOSIS — T781XXD Other adverse food reactions, not elsewhere classified, subsequent encounter: Secondary | ICD-10-CM | POA: Diagnosis not present

## 2019-10-16 DIAGNOSIS — J3089 Other allergic rhinitis: Secondary | ICD-10-CM | POA: Diagnosis not present

## 2019-10-16 DIAGNOSIS — J4520 Mild intermittent asthma, uncomplicated: Secondary | ICD-10-CM | POA: Diagnosis not present

## 2019-10-16 DIAGNOSIS — J302 Other seasonal allergic rhinitis: Secondary | ICD-10-CM

## 2019-10-16 MED ORDER — LEVOCETIRIZINE DIHYDROCHLORIDE 5 MG PO TABS: 5.0000 mg | ORAL_TABLET | Freq: Every evening | ORAL | 5 refills | Status: DC

## 2019-10-16 MED FILL — LEVOCETIRIZINE 5 MG TABLET: 5 | 30 days supply | Qty: 30 | Fill #0

## 2019-10-16 NOTE — Progress Notes (Signed)
RE: Amanda Blackburn MRN: [id number] DOB: 01/06/2001 Date of Telemedicine Visit: 10/16/2019  Referring provider: Quentin Ore * Primary care provider: McLean-Scocuzza, Pasty Spillers, MD  Chief Complaint: Allergies (Allergies have gotten worse. Interested in allergy injections. )   Telemedicine Follow Up Visit via Telephone: I connected with Amanda Blackburn for a follow up on 10/16/19 by telephone and verified that I am speaking with the correct person using two identifiers.   I discussed the limitations, risks, security and privacy concerns of performing an evaluation and management service by telephone and the availability of in person appointments. I also discussed with the patient that there may be a patient responsible charge related to this service. The patient expressed understanding and agreed to proceed.  Patient is at home Provider is at the office.  Visit start time: 228 Visit end time: 86 Insurance consent/check in by: D Medical consent and medical assistant/nurse: French Ana  History of Present Illness: She is a 19 y.o. female, who is being followed for asthma, allergic rhinitis, allergic conjunctivitis, and pollen food allergy. Her previous allergy office visit was on 04/21/2019 with Thermon Leyland, FNP.  At today's visit, she reports her asthma has been well controlled with shortness of breath occurring 1 time, no wheezing, and frequent cough producing clear mucus.  She continues montelukast 10 mg once a day and he has used her albuterol inhaler 1 time since her last visit to this clinic.  Allergic rhinitis is reported as not well controlled with nasal congestion, clear rhinorrhea, and frequent sneezing.  She has taken cetirizine daily with no relief of symptoms.  She is using Flonase as needed.  Allergic conjunctivitis is reported as moderately well controlled with precautions including wearing gloves and sunglasses.  She reports she uses Pataday with relief of symptoms.  She continues to  avoid foods that make her mouth itch such as fresh apples, kiwi, and peaches.  She denies concomitant cardiopulmonary, angioedema, or gastrointestinal symptoms with mouth itching after ingesting fruits.  Her current medications are listed in the chart.   Assessment and Plan: Amanda Blackburn is a 19 y.o. female with: Patient Instructions  Asthma Continue montelukast 10 mg once a day to prevent cough and wheeze Continue albuterol 2 puffs every 4 hours as needed for cough or wheeze Asthma control goals:   Full participation in all desired activities (may need albuterol before activity)  Albuterol use two time or less a week on average (not counting use with activity)  Cough interfering with sleep two time or less a month  Oral steroids no more than once a year  No hospitalizations   Allergic rhinitis Continue to avoid grass pollen, weed, pollen, tree pollen, rag weed pollen, mold, dust mite, and cat hair Stop cetirizine and begin Xyzal 5 mg once a day as needed for a runny nose. Remember to rotate to a different antihistamine about every 3 months. Some examples of over the counter antihistamines include Zyrtec (cetirizine), Xyzal (levocetirizine), Allegra (fexofenadine), and Claritin (loratidine).  Continue Flonase 1-2 sprays in each nostril once a day as needed for a stuffy nose.  In the right nostril, point the applicator out toward the right ear. In the left nostril, point the applicator out toward the left ear We will schedule an appointment for your first allergen immunotherapy injection.   Allergic conjunctivitis Continue Patanol 1 drop in each eye twice a day as needed for red, itchy eyes.  Oral allergy syndrome Continue to avoid the foods that make your mouth itch The oral  allergy syndrome (OAS) or pollen-food allergy syndrome (PFAS) is a relatively common form of food allergy, particularly in adults. It typically occurs in people who have pollen allergies when the immune system "sees"  proteins on the food that look like proteins on the pollen. This results in the allergy antibody (IgE) binding to the food instead of the pollen. Patients typically report itching and/or mild swelling of the mouth and throat immediately following ingestion of certain uncooked fruits (including nuts) or raw vegetables. Only a very small number of affected individuals experience systemic allergic reactions, such as anaphylaxis which occurs with true food allergies.    Call the clinic if this treatment plan is not working well for you  Follow up in 3 months or sooner if needed    Return in about 3 months (around 01/15/2020), or if symptoms worsen or fail to improve.  Meds ordered this encounter  Medications  . levocetirizine (XYZAL) 5 MG tablet    Sig: Take 1 tablet (5 mg total) by mouth every evening.    Dispense:  30 tablet    Refill:  5    Medication List:  Current Outpatient Medications  Medication Sig Dispense Refill  . albuterol (PROAIR HFA) 108 (90 Base) MCG/ACT inhaler Inhale 2 puffs into the lungs every 4 (four) hours as needed for wheezing or shortness of breath. 1 Inhaler 1  . Beclomethasone Diprop HFA (QVAR REDIHALER) 40 MCG/ACT AERB Inhale 2 puffs into the lungs 2 (two) times daily. (Patient taking differently: Inhale 2 puffs into the lungs 2 (two) times daily. Uses as needed.) 1 Inhaler 3  . Carbinoxamine Maleate (RYVENT) 6 MG TABS Take 1 tablet by mouth 2 (two) times daily. (Patient taking differently: Take 1 tablet by mouth 2 (two) times daily. Uses as needed.) 60 tablet 5  . desonide (DESOWEN) 0.05 % cream Apply topically 2 (two) times daily. To eyelids and under eyes as needed 60 g 0  . EPINEPHrine (AUVI-Q) 0.3 mg/0.3 mL IJ SOAJ injection Inject 0.3 mLs (0.3 mg total) into the muscle as needed for anaphylaxis. 2 each 1  . FLUoxetine (PROZAC) 10 MG capsule Take 1 capsule (10 mg total) by mouth daily. 90 capsule 3  . fluticasone (FLONASE) 50 MCG/ACT nasal spray Place 2 sprays  into both nostrils daily. 16 g 5  . ibuprofen (ADVIL,MOTRIN) 400 MG tablet Take 1 tablet (400 mg total) by mouth every 6 (six) hours as needed. 30 tablet 0  . montelukast (SINGULAIR) 10 MG tablet Take 1 tablet (10 mg total) by mouth at bedtime. 30 tablet 5  . olopatadine (PATANOL) 0.1 % ophthalmic solution PLACE 1 DROP INTO BOTH EYES DAILY AS NEEDED FOR ALLERGIES. 5 mL 0  . ipratropium (ATROVENT) 0.06 % nasal spray Place 2 sprays into both nostrils 4 (four) times daily. (Patient not taking: Reported on 10/16/2019) 15 mL 12  . levocetirizine (XYZAL) 5 MG tablet Take 1 tablet (5 mg total) by mouth every evening. 30 tablet 5   No current facility-administered medications for this visit.   Allergies: Allergies  Allergen Reactions  . Nickel Rash    Rash   . Sulfa Antibiotics Rash    Rash    I reviewed her past medical history, social history, family history, and environmental history and no significant changes have been reported from previous visit on 04/21/2019.  Objective: Physical Exam Not obtained as encounter was done via telephone.   Previous notes and tests were reviewed.  I discussed the assessment and treatment plan with  the patient. The patient was provided an opportunity to ask questions and all were answered. The patient agreed with the plan and demonstrated an understanding of the instructions.   The patient was advised to call back or seek an in-person evaluation if the symptoms worsen or if the condition fails to improve as anticipated.  I provided 23 minutes of non-face-to-face time during this encounter.  It was my pleasure to participate in [place] care today. Please feel free to contact me with any questions or concerns.   Sincerely,  Gareth Morgan, FNP

## 2019-10-16 NOTE — Telephone Encounter (Signed)
Error

## 2019-10-16 NOTE — Patient Instructions (Addendum)
Asthma Continue montelukast 10 mg once a day to prevent cough and wheeze Continue albuterol 2 puffs every 4 hours as needed for cough or wheeze Asthma control goals:   Full participation in all desired activities (may need albuterol before activity)  Albuterol use two time or less a week on average (not counting use with activity)  Cough interfering with sleep two time or less a month  Oral steroids no more than once a year  No hospitalizations   Allergic rhinitis Continue to avoid grass pollen, weed, pollen, tree pollen, rag weed pollen, mold, dust mite, and cat hair Stop cetirizine and begin Xyzal 5 mg once a day as needed for a runny nose. Remember to rotate to a different antihistamine about every 3 months. Some examples of over the counter antihistamines include Zyrtec (cetirizine), Xyzal (levocetirizine), Allegra (fexofenadine), and Claritin (loratidine).  Continue Flonase 1-2 sprays in each nostril once a day as needed for a stuffy nose.  In the right nostril, point the applicator out toward the right ear. In the left nostril, point the applicator out toward the left ear We will schedule an appointment for your first allergen immunotherapy injection.   Allergic conjunctivitis Continue Patanol 1 drop in each eye twice a day as needed for red, itchy eyes.  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 It typically occurs in people who have pollen allergies when the immune system "sees" proteins on the food that look like proteins on the pollen. This results in the allergy antibody (IgE) binding to the food instead of the pollen. Patients typically report itching and/or mild swelling of the mouth and throat immediately following ingestion of certain uncooked fruits (including nuts) or raw vegetables. Only a very small  number of affected individuals experience systemic allergic reactions, such as anaphylaxis which occurs with true food allergies.      Call the clinic if this treatment plan is not working well for you  Follow up in 3 months or sooner if needed  Reducing Pollen Exposure The American Academy of Allergy, Asthma and Immunology suggests the following steps to reduce your exposure to pollen during allergy seasons. 1. Do not hang sheets or clothing out to dry; pollen may collect on these items. 2. Do not mow lawns or spend time around freshly cut grass; mowing stirs up pollen. 3. Keep windows closed at night.  Keep car windows closed while driving. 4. Minimize morning activities outdoors, a time when pollen counts are usually at their highest. 5. Stay indoors as much as possible when pollen counts or humidity is high and on windy days when pollen tends to remain in the air longer. 6. Use air conditioning when possible.  Many air conditioners have filters that trap the pollen spores. 7. Use a HEPA room air filter to remove pollen form the indoor air you breathe.  Control of Mold Allergen Mold and fungi can grow on a variety of surfaces provided certain temperature and moisture conditions exist.  Outdoor molds grow on plants, decaying vegetation and soil.  The major outdoor mold, Alternaria and Cladosporium, are found in very high numbers during hot and dry conditions.  Generally, a late Summer - Fall peak is seen for common outdoor fungal spores.  Rain will temporarily lower outdoor mold spore count, but counts rise rapidly when the rainy period ends.  The  most important indoor molds are Aspergillus and Penicillium.  Dark, humid and poorly ventilated basements are ideal sites for mold growth.  The next most common sites of mold growth are the bathroom and the kitchen.  Outdoor [employer] 8. Use air conditioning and keep windows closed 9. Avoid exposure to decaying vegetation. 10. Avoid leaf  raking. 11. Avoid grain handling. 12. Consider wearing a face mask if working in moldy areas.  Indoor Mold Control 1. Maintain humidity below 50%. 2. Clean washable surfaces with 5% bleach solution. 3. Remove sources e.g. Contaminated carpets.  Control of House Dust Mite Allergen House dust mites play a major role in allergic asthma and rhinitis.  They occur in environments with high humidity wherever human skin, the food for dust mites is found. High levels have been detected in dust obtained from mattresses, pillows, carpets, upholstered furniture, bed covers, clothes and soft toys.  The principal allergen of the house dust mite is found in its feces.  A gram of dust may contain 1,000 mites and 250,000 fecal particles.  Mite antigen is easily measured in the air during house cleaning activities.    1. Encase mattresses, including the box spring, and pillow, in an air tight cover.  Seal the zipper end of the encased mattresses with wide adhesive tape. 2. Wash the bedding in water of 130 degrees Farenheit weekly.  Avoid cotton comforters/quilts and flannel bedding: the most ideal bed covering is the dacron comforter. 3. Remove all upholstered furniture from the bedroom. 4. Remove carpets, carpet padding, rugs, and non-washable window drapes from the bedroom.  Wash drapes weekly or use plastic window coverings. 5. Remove all non-washable stuffed toys from the bedroom.  Wash stuffed toys weekly. 6. Have the room cleaned frequently with a vacuum cleaner and a damp dust-mop.  The patient should not be in a room which is being cleaned and should wait 1 hour after cleaning before going into the room. 7. Close and seal all heating outlets in the bedroom.  Otherwise, the room will become filled with dust-laden air.  An electric heater can be used to heat the room. 8. Reduce indoor humidity to less than 50%.  Do not use a humidifier.   Control of Dog or Cat Allergen Avoidance is the best way to manage  a dog or cat allergy. If you have a dog or cat and are allergic to dog or cats, consider removing the dog or cat from the home. If you have a dog or cat but don't want to find it a new home, or if your family wants a pet even though someone in the household is allergic, here are some strategies that may help keep symptoms at bay:  13. Keep the pet out of your bedroom and restrict it to only a few rooms. Be advised that keeping the dog or cat in only one room will not limit the allergens to that room. 60. Don't pet, hug or kiss the dog or cat; if you do, wash your hands with soap and water. 15. High-efficiency particulate air (HEPA) cleaners run continuously in a bedroom or living room can reduce allergen levels over time. 16. Regular use of a high-efficiency vacuum cleaner or a central vacuum can reduce allergen levels. 17. Giving your dog or cat a bath at least once a week can reduce airborne allergen.

## 2019-10-17 NOTE — Progress Notes (Signed)
VIALS EXP 10-17-20 

## 2019-10-17 NOTE — Addendum Note (Signed)
Addended by: Lorrin Mais on: 10/17/2019 01:56 PM   Modules accepted: Orders

## 2019-10-18 DIAGNOSIS — J3081 Allergic rhinitis due to animal (cat) (dog) hair and dander: Secondary | ICD-10-CM | POA: Diagnosis not present

## 2019-10-19 DIAGNOSIS — J3089 Other allergic rhinitis: Secondary | ICD-10-CM | POA: Diagnosis not present

## 2019-10-25 ENCOUNTER — Encounter: Payer: Self-pay | Admitting: Internal Medicine

## 2019-10-25 ENCOUNTER — Other Ambulatory Visit: Payer: Self-pay

## 2019-10-25 ENCOUNTER — Ambulatory Visit (INDEPENDENT_AMBULATORY_CARE_PROVIDER_SITE_OTHER): Payer: No Typology Code available for payment source | Admitting: Internal Medicine

## 2019-10-25 VITALS — BP 124/82 | HR 76 | Temp 97.1°F | Ht 63.5 in | Wt 163.8 lb

## 2019-10-25 DIAGNOSIS — J309 Allergic rhinitis, unspecified: Secondary | ICD-10-CM

## 2019-10-25 DIAGNOSIS — F419 Anxiety disorder, unspecified: Secondary | ICD-10-CM

## 2019-10-25 DIAGNOSIS — L309 Dermatitis, unspecified: Secondary | ICD-10-CM

## 2019-10-25 DIAGNOSIS — L739 Follicular disorder, unspecified: Secondary | ICD-10-CM

## 2019-10-25 DIAGNOSIS — F329 Major depressive disorder, single episode, unspecified: Secondary | ICD-10-CM

## 2019-10-25 DIAGNOSIS — N760 Acute vaginitis: Secondary | ICD-10-CM

## 2019-10-25 MED ORDER — DOXYCYCLINE HYCLATE 100 MG PO TABS: 100.0000 mg | ORAL_TABLET | Freq: Two times a day (BID) | ORAL | 0 refills | Status: DC

## 2019-10-25 MED ORDER — FLUOXETINE HCL 10 MG PO CAPS: 10.0000 mg | ORAL_CAPSULE | Freq: Every day | ORAL | 3 refills | Status: DC

## 2019-10-25 MED ORDER — FLUCONAZOLE 150 MG PO TABS: 150.0000 mg | ORAL_TABLET | Freq: Once | ORAL | 0 refills | Status: AC

## 2019-10-25 MED ORDER — METRONIDAZOLE 500 MG PO TABS: 500.0000 mg | ORAL_TABLET | Freq: Two times a day (BID) | ORAL | 0 refills | Status: DC

## 2019-10-25 MED FILL — DOXYCYCLINE HYCLATE 100 MG: 100 | 7 days supply | Qty: 14 | Fill #0

## 2019-10-25 MED FILL — METRONIDAZOLE 500 MG TABS: 500 | 7 days supply | Qty: 14 | Fill #0

## 2019-10-25 MED FILL — FLUCONAZOLE 150 MG TABS: 150 | 3 days supply | Qty: 2 | Fill #0

## 2019-10-25 MED FILL — FLUoxetine HCL 10 MG CAPS: 10 | 90 days supply | Qty: 90 | Fill #0

## 2019-10-25 NOTE — Progress Notes (Signed)
Chief Complaint  Patient presents with  . Vaginitis  . Urinary Tract Infection   F/u  1. Co vaginal itching w/o odor or burning. She uses dove soap. She has also seen yellow/white dishcarge at times  She also has bumps in private area which are tender right labia x 2 nothing tried  She has h/o BV, yeast, chlamydia as well   2. Allergies appt 4/19 with allergist virtually and needs allergy shots in [place] Dr. Neldon Mc  3. Anxiety depression GAD 7 score 13 and PHQ 9 score 14 on prozac   Review of Systems  Respiratory: Negative for shortness of breath.   Cardiovascular: Negative for chest pain.  Genitourinary: Negative for dysuria.       +vaginal irritation and discharge    Psychiatric/Behavioral: Positive for depression. The patient is nervous/anxious.    Past Medical History:  Diagnosis Date  . Allergy   . Anxiety   . Asthma   . Depression    sees therapy   . Eczema   . Frequent headaches   . Seasonal allergies    Past Surgical History:  Procedure Laterality Date  . NO PAST SURGERIES     Family History  Problem Relation Age of Onset  . Diabetes Mother   . Asthma Mother   . Hypertension Father   . Asthma Sister    Social History   Socioeconomic History  . Marital status: Single    Spouse name: Not on file  . Number of children: Not on file  . Years of education: Not on file  . Highest education level: Not on file  Occupational History  . Not on file  Tobacco Use  . Smoking status: Never Smoker  . Smokeless tobacco: Never Used  Substance and Sexual Activity  . Alcohol use: No  . Drug use: No  . Sexual activity: Yes  Other Topics Concern  . Not on file  Social History Narrative   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     Single as of 03/02/2019    No guns    Wears seat belt   Safe in relationship    No etoh or drugs    She wants to be a Engineer, manufacturing systems   Works [employer] in [place] Strain:   . Difficulty of  Paying Living Expenses:   Food Insecurity:   . Worried About Charity fundraiser in the Last Year:   . Arboriculturist in the Last Year:   Transportation Needs:   . Film/video editor (Medical):   Marland Kitchen Lack of Transportation (Non-Medical):   Physical Activity:   . Days of Exercise per Week:   . Minutes of Exercise per Session:   Stress:   . Feeling of Stress :   Social Connections:   . Frequency of Communication with Friends and Family:   . Frequency of Social Gatherings with Friends and Family:   . Attends Religious Services:   . Active Member of Clubs or Organizations:   . Attends Archivist Meetings:   Marland Kitchen Marital Status:   Intimate Partner Violence:   . Fear of Current or Ex-Partner:   . Emotionally Abused:   Marland Kitchen Physically Abused:   . Sexually Abused:    Current Meds  Medication Sig  . albuterol (PROAIR HFA) 108 (90 Base) MCG/ACT inhaler Inhale 2 puffs into the lungs every 4 (four) hours as needed for wheezing or shortness  of breath.  . Beclomethasone Diprop HFA (QVAR REDIHALER) 40 MCG/ACT AERB Inhale 2 puffs into the lungs 2 (two) times daily. (Patient taking differently: Inhale 2 puffs into the lungs 2 (two) times daily. Uses as needed.)  . desonide (DESOWEN) 0.05 % cream Apply topically 2 (two) times daily. To eyelids and under eyes as needed  . EPINEPHrine (AUVI-Q) 0.3 mg/0.3 mL IJ SOAJ injection Inject 0.3 mLs (0.3 mg total) into the muscle as needed for anaphylaxis.  Marland Kitchen FLUoxetine (PROZAC) 10 MG capsule Take 1 capsule (10 mg total) by mouth daily.  . fluticasone (FLONASE) 50 MCG/ACT nasal spray Place 2 sprays into both nostrils daily.  Marland Kitchen ibuprofen (ADVIL,MOTRIN) 400 MG tablet Take 1 tablet (400 mg total) by mouth every 6 (six) hours as needed.  Marland Kitchen ipratropium (ATROVENT) 0.06 % nasal spray Place 2 sprays into both nostrils 4 (four) times daily.  Marland Kitchen levocetirizine (XYZAL) 5 MG tablet Take 1 tablet (5 mg total) by mouth every evening.  . montelukast (SINGULAIR) 10 MG  tablet Take 1 tablet (10 mg total) by mouth at bedtime.  Marland Kitchen olopatadine (PATANOL) 0.1 % ophthalmic solution PLACE 1 DROP INTO BOTH EYES DAILY AS NEEDED FOR ALLERGIES.  . [DISCONTINUED] FLUoxetine (PROZAC) 10 MG capsule Take 1 capsule (10 mg total) by mouth daily.   Allergies  Allergen Reactions  . Nickel Rash    Rash   . Sulfa Antibiotics Rash    Rash    Recent Results (from the past 2160 hour(s))  Novel Coronavirus, NAA ([facility])     Status: None   Collection Time: 08/07/19  5:59 PM   Specimen: Nasopharyngeal(NP) swabs in vial transport medium   NASOPHARYNGE  SCREENIN  Result Value Ref Range   SARS-CoV-2, NAA Not Detected Not Detected    Comment: This nucleic acid amplification test was developed and its performance characteristics determined by [employer]. Nucleic acid amplification tests include RT-PCR and TMA. This test has not been FDA cleared or approved. This test has been authorized by FDA under an Emergency Use Authorization (EUA). This test is only authorized for the duration of time the declaration that circumstances exist justifying the authorization of the emergency use of in vitro diagnostic tests for detection of SARS-CoV-2 virus and/or diagnosis of COVID-19 infection under section 564(b)(1) of the Act, 21 U.S.C. 643PIR-5(J) (1), unless the authorization is terminated or revoked sooner. When diagnostic testing is negative, the possibility of a false negative result should be considered in the context of a patient's recent exposures and the presence of clinical signs and symptoms consistent with COVID-19. An individual without symptoms of COVID-19 and who is not shedding SARS-CoV-2 virus wo uld expect to have a negative (not detected) result in this assay.    Objective  Body mass index is 28.56 kg/m. Wt Readings from Last 3 Encounters:  10/25/19 163 lb 12.8 oz (74.3 kg)  04/21/19 153 lb 12.8 oz (69.8 kg) (83 %, Z= 0.97)*  07/21/18 158 lb 12.8 oz (72  kg) (88 %, Z= 1.16)*   * Growth percentiles are based on CDC (Girls, 2-20 Years) data.   Temp Readings from Last 3 Encounters:  10/25/19 (!) 97.1 F (36.2 C) (Temporal)  04/21/19 97.9 F (36.6 C) (Temporal)  07/21/18 98.8 F (37.1 C)   BP Readings from Last 3 Encounters:  10/25/19 124/82  04/21/19 98/70  08/16/18 120/90   Pulse Readings from Last 3 Encounters:  10/25/19 76  04/21/19 88  08/16/18 93    Physical Exam Vitals and nursing  note reviewed. Exam conducted with a chaperone present.  Constitutional:      Appearance: Normal appearance. She is well-developed and well-groomed.  HENT:     Head: Normocephalic and atraumatic.  Eyes:     Conjunctiva/sclera: Conjunctivae normal.     Pupils: Pupils are equal, round, and reactive to light.  Cardiovascular:     Rate and Rhythm: Normal rate and regular rhythm.     Heart sounds: Normal heart sounds.  Pulmonary:     Effort: Pulmonary effort is normal.     Breath sounds: Normal breath sounds.  Genitourinary:    Exam position: Supine.     Pubic Area: No rash.      Labia:        Right: Lesion present.      Vagina: Normal.     Cervix: Discharge present.       Comments: White yellow discharge   Skin:    General: Skin is warm and dry.  Neurological:     General: No focal deficit present.     Mental Status: She is alert and oriented to person, place, and time. Mental status is at baseline.     Gait: Gait normal.  Psychiatric:        Attention and Perception: Attention and perception normal.        Mood and Affect: Mood and affect normal.        Speech: Speech normal.        Behavior: Behavior normal. Behavior is cooperative.        Thought Content: Thought content normal.        Cognition and Memory: Cognition and memory normal.        Judgment: Judgment normal.     Assessment  Plan    Acute vaginitis - Plan: fluconazole (DIFLUCAN) 150 MG tablet x 1 may repeat dose in 3 days prn, metroNIDAZOLE (FLAGYL) 500 MG  tablet bid x 1 week, NuSwab BV and Candida, NAA Disc otc boric acid for BV in future for prevention  Folliculitis - Plan: doxycycline (VIBRA-TABS) 100 MG tablet bid x 5-7 days  Warm compression  Anxiety and depression - Plan: FLUoxetine (PROZAC) 10 MG capsule  Allergic rhinitis, unspecified seasonality, unspecified trigger - Plan: Ambulatory referral to Allergy Dr. Neldon Mc allergy shots  HM Flu shotutd 10/12020  Tdap had 08/18/10 will need in 10 years if not had  HPV vx had  Hep A/B vx hadbut updated 09/20/2018 and 09/2018 with new Hep B vaccine  -immune 03/30/2019 hep B titer had 2/2 new hep B vaccine  Meningococcal vx had 01/14/18 MMR immune  Never had pap will do age 40  Disc safe sex practices on OCP See prior STD check Disc exercise to lose goal wt for now 130s given ht 5'3" rec responsible choices  No etoh or tobacco use   Provider: Dr. Olivia Mackie McLean-Scocuzza-[facility] Medicine

## 2019-10-25 NOTE — Patient Instructions (Addendum)
Honey pot products vaginal wash to consider for yeast/bacterial infections has boric acid helps with bacterial vaginosis  Over the counter boric acid  Diflucan for yeast if not better repeat in 3 days  Flagyl is for bacterial vaginosis Doxycycline is for folliculitis   Folliculitis  Folliculitis is inflammation of the hair follicles. Folliculitis most commonly occurs on the scalp, thighs, legs, back, and buttocks. However, it can occur anywhere on the body. What are the causes? This condition may be caused by:  A bacterial infection (common).  A fungal infection.  A viral infection.  Contact with certain chemicals, especially oils and tars.  Shaving or waxing.  Greasy ointments or creams applied to the skin. Long-lasting folliculitis and folliculitis that keeps coming back may be caused by bacteria. This bacteria can live anywhere on your skin and is often found in the nostrils. What increases the risk? You are more likely to develop this condition if you have:  A weakened immune system.  Diabetes.  Obesity. What are the signs or symptoms? Symptoms of this condition include:  Redness.  Soreness.  Swelling.  Itching.  Small white or yellow, pus-filled, itchy spots (pustules) that appear over a reddened area. If there is an infection that goes deep into the follicle, these may develop into a boil (furuncle).  A group of closely packed boils (carbuncle). These tend to form in hairy, sweaty areas of the body. How is this diagnosed? This condition is diagnosed with a skin exam. To find what is causing the condition, your health care provider may take a sample of one of the pustules or boils for testing in a lab. How is this treated? This condition may be treated by:  Applying warm compresses to the affected areas.  Taking an antibiotic medicine or applying an antibiotic medicine to the skin.  Applying or bathing with an antiseptic solution.  Taking an  over-the-counter medicine to help with itching.  Having a procedure to drain any pustules or boils. This may be done if a pustule or boil contains a lot of pus or fluid.  Having laser hair removal. This may be done to treat long-lasting folliculitis. Follow these instructions at home: Managing pain and swelling   If directed, apply heat to the affected area as often as told by your health care provider. Use the heat source that your health care provider recommends, such as a moist heat pack or a heating pad. ? Place a towel between your skin and the heat source. ? Leave the heat on for 20-30 minutes. ? Remove the heat if your skin turns bright red. This is especially important if you are unable to feel pain, heat, or cold. You may have a greater risk of getting burned. General instructions  If you were prescribed an antibiotic medicine, take it or apply it as told by your health care provider. Do not stop using the antibiotic even if your condition improves.  Check the irritated area every day for signs of infection. Check for: ? Redness, swelling, or pain. ? Fluid or blood. ? Warmth. ? Pus or a bad smell.  Do not shave irritated skin.  Take over-the-counter and prescription medicines only as told by your health care provider.  Keep all follow-up visits as told by your health care provider. This is important. Get help right away if:  You have more redness, swelling, or pain in the affected area.  Red streaks are spreading from the affected area.  You have a fever. Summary  Folliculitis is inflammation of the hair follicles. Folliculitis most commonly occurs on the scalp, thighs, legs, back, and buttocks.  This condition may be treated by taking an antibiotic medicine or applying an antibiotic medicine to the skin, and applying or bathing with an antiseptic solution.  If you were prescribed an antibiotic medicine, take it or apply it as told by your health care provider. Do  not stop using the antibiotic even if your condition improves.  Get help right away if you have new or worsening symptoms.  Keep all follow-up visits as told by your health care provider. This is important. This information is not intended to replace advice given to you by your health care provider. Make sure you discuss any questions you have with your health care provider. Document Revised: 01/22/2018 Document Reviewed: 01/22/2018 Elsevier Patient Education  2020 Elsevier Inc.  Vaginal Yeast Infection, Adult  Vaginal yeast infection is a condition that causes vaginal discharge as well as soreness, swelling, and redness (inflammation) of the vagina. This is a common condition. Some women get this infection frequently. What are the causes? This condition is caused by a change in the normal balance of the yeast (candida) and bacteria that live in the vagina. This change causes an overgrowth of yeast, which causes the inflammation. What increases the risk? The condition is more likely to develop in women who:  Take antibiotic medicines.  Have diabetes.  Take birth control pills.  Are pregnant.  Douche often.  Have a weak body defense system (immune system).  Have been taking steroid medicines for a long time.  Frequently wear tight clothing. What are the signs or symptoms? Symptoms of this condition include:  White, thick, creamy vaginal discharge.  Swelling, itching, redness, and irritation of the vagina. The lips of the vagina (vulva) may be affected as well.  Pain or a burning feeling while urinating.  Pain during sex. How is this diagnosed? This condition is diagnosed based on:  Your medical history.  A physical exam.  A pelvic exam. Your health care provider will examine a sample of your vaginal discharge under a microscope. Your health care provider may send this sample for testing to confirm the diagnosis. How is this treated? This condition is treated with  medicine. Medicines may be over-the-counter or prescription. You may be told to use one or more of the following:  Medicine that is taken by mouth (orally).  Medicine that is applied as a cream (topically).  Medicine that is inserted directly into the vagina (suppository). Follow these instructions at home:  Lifestyle  Do not have sex until your health care provider approves. Tell your sex partner that you have a yeast infection. That person should go to his or her health care provider and ask if they should also be treated.  Do not wear tight clothes, such as pantyhose or tight pants.  Wear breathable cotton underwear. General instructions  Take or apply over-the-counter and prescription medicines only as told by your health care provider.  Eat more yogurt. This may help to keep your yeast infection from returning.  Do not use tampons until your health care provider approves.  Try taking a sitz bath to help with discomfort. This is a warm water bath that is taken while you are sitting down. The water should only come up to your hips and should cover your buttocks. Do this 3-4 times per day or as told by your health care provider.  Do not douche.  If you  have diabetes, keep your blood sugar levels under control.  Keep all follow-up visits as told by your health care provider. This is important. Contact a health care provider if:  You have a fever.  Your symptoms go away and then return.  Your symptoms do not get better with treatment.  Your symptoms get worse.  You have new symptoms.  You develop blisters in or around your vagina.  You have blood coming from your vagina and it is not your menstrual period.  You develop pain in your abdomen. Summary  Vaginal yeast infection is a condition that causes discharge as well as soreness, swelling, and redness (inflammation) of the vagina.  This condition is treated with medicine. Medicines may be over-the-counter or  prescription.  Take or apply over-the-counter and prescription medicines only as told by your health care provider.  Do not douche. Do not have sex or use tampons until your health care provider approves.  Contact a health care provider if your symptoms do not get better with treatment or your symptoms go away and then return. This information is not intended to replace advice given to you by your health care provider. Make sure you discuss any questions you have with your health care provider. Document Revised: 01/13/2019 Document Reviewed: 11/01/2017 Elsevier Patient Education  2020 Elsevier Inc.  Bacterial Vaginosis  Bacterial vaginosis is a vaginal infection that occurs when the normal balance of bacteria in the vagina is disrupted. It results from an overgrowth of certain bacteria. This is the most common vaginal infection among women ages 58-44. Because bacterial vaginosis increases your risk for STIs (sexually transmitted infections), getting treated can help reduce your risk for chlamydia, gonorrhea, herpes, and HIV (human immunodeficiency virus). Treatment is also important for preventing complications in pregnant women, because this condition can cause an early (premature) delivery. What are the causes? This condition is caused by an increase in harmful bacteria that are normally present in small amounts in the vagina. However, the reason that the condition develops is not fully understood. What increases the risk? The following factors may make you more likely to develop this condition:  Having a new sexual partner or multiple sexual partners.  Having unprotected sex.  Douching.  Having an intrauterine device (IUD).  Smoking.  Drug and alcohol abuse.  Taking certain antibiotic medicines.  Being pregnant. You cannot get bacterial vaginosis from toilet seats, bedding, swimming pools, or contact with objects around you. What are the signs or symptoms? Symptoms of this  condition include:  Grey or white vaginal discharge. The discharge can also be watery or foamy.  A fish-like odor with discharge, especially after sexual intercourse or during menstruation.  Itching in and around the vagina.  Burning or pain with urination. Some women with bacterial vaginosis have no signs or symptoms. How is this diagnosed? This condition is diagnosed based on:  Your medical history.  A physical exam of the vagina.  Testing a sample of vaginal fluid under a microscope to look for a large amount of bad bacteria or abnormal cells. Your health care provider may use a cotton swab or a small wooden spatula to collect the sample. How is this treated? This condition is treated with antibiotics. These may be given as a pill, a vaginal cream, or a medicine that is put into the vagina (suppository). If the condition comes back after treatment, a second round of antibiotics may be needed. Follow these instructions at home: Medicines  Take over-the-counter and prescription medicines  only as told by your health care provider.  Take or use your antibiotic as told by your health care provider. Do not stop taking or using the antibiotic even if you start to feel better. General instructions  If you have a female sexual partner, tell her that you have a vaginal infection. She should see her health care provider and be treated if she has symptoms. If you have a female sexual partner, he does not need treatment.  During treatment: ? Avoid sexual activity until you finish treatment. ? Do not douche. ? Avoid alcohol as directed by your health care provider. ? Avoid breastfeeding as directed by your health care provider.  Drink enough water and fluids to keep your urine clear or pale yellow.  Keep the area around your vagina and rectum clean. ? Wash the area daily with warm water. ? Wipe yourself from front to back after using the toilet.  Keep all follow-up visits as told by your  health care provider. This is important. How is this prevented?  Do not douche.  Wash the outside of your vagina with warm water only.  Use protection when having sex. This includes latex condoms and dental dams.  Limit how many sexual partners you have. To help prevent bacterial vaginosis, it is best to have sex with just one partner (monogamous).  Make sure you and your sexual partner are tested for STIs.  Wear cotton or cotton-lined underwear.  Avoid wearing tight pants and pantyhose, especially during summer.  Limit the amount of alcohol that you drink.  Do not use any products that contain nicotine or tobacco, such as cigarettes and e-cigarettes. If you need help quitting, ask your health care provider.  Do not use illegal drugs. Where to find more information  Centers for Disease Control and Prevention: AppraiserFraud.fi  American Sexual Health Association (ASHA): www.ashastd.org  U.S. Department of Health and Financial controller, Office on Women's Health: DustingSprays.pl or SecuritiesCard.it Contact a health care provider if:  Your symptoms do not improve, even after treatment.  You have more discharge or pain when urinating.  You have a fever.  You have pain in your abdomen.  You have pain during sex.  You have vaginal bleeding between periods. Summary  Bacterial vaginosis is a vaginal infection that occurs when the normal balance of bacteria in the vagina is disrupted.  Because bacterial vaginosis increases your risk for STIs (sexually transmitted infections), getting treated can help reduce your risk for chlamydia, gonorrhea, herpes, and HIV (human immunodeficiency virus). Treatment is also important for preventing complications in pregnant women, because the condition can cause an early (premature) delivery.  This condition is treated with antibiotic medicines. These may be given as a pill, a vaginal cream, or a  medicine that is put into the vagina (suppository). This information is not intended to replace advice given to you by your health care provider. Make sure you discuss any questions you have with your health care provider. Document Revised: 05/28/2017 Document Reviewed: 02/29/2016 Elsevier Patient Education  2020 [employer].

## 2019-10-27 LAB — NUSWAB BV AND CANDIDA, NAA
Candida albicans, NAA: POSITIVE — AB
Candida glabrata, NAA: NEGATIVE

## 2019-10-30 ENCOUNTER — Ambulatory Visit: Payer: No Typology Code available for payment source

## 2019-11-08 ENCOUNTER — Other Ambulatory Visit: Payer: Self-pay

## 2019-11-08 ENCOUNTER — Encounter: Payer: Self-pay | Admitting: Allergy

## 2019-11-08 ENCOUNTER — Ambulatory Visit (INDEPENDENT_AMBULATORY_CARE_PROVIDER_SITE_OTHER): Payer: No Typology Code available for payment source

## 2019-11-08 DIAGNOSIS — J309 Allergic rhinitis, unspecified: Secondary | ICD-10-CM | POA: Diagnosis not present

## 2019-11-08 NOTE — Progress Notes (Signed)
Immunotherapy   Patient Details  Name: Amanda Blackburn MRN: [id number] Date of Birth: 01-19-2001  11/08/2019  [name] started allergy injections. Patient received 0.05 out of both his blue vials with an expiration of 10/17/2020. One with Pollen-Pet and the other with Mold-Mite. Patient waited in an exam room for 30 minutes with no problems. Following schedule: B   Frequency: Weekly Epi-Pen: Yes Consent signed and patient instructions given.   Dub Mikes 11/08/2019, 3:45 PM

## 2019-11-17 ENCOUNTER — Ambulatory Visit (INDEPENDENT_AMBULATORY_CARE_PROVIDER_SITE_OTHER): Payer: No Typology Code available for payment source

## 2019-11-17 DIAGNOSIS — J309 Allergic rhinitis, unspecified: Secondary | ICD-10-CM | POA: Diagnosis not present

## 2019-11-24 ENCOUNTER — Ambulatory Visit (INDEPENDENT_AMBULATORY_CARE_PROVIDER_SITE_OTHER): Payer: No Typology Code available for payment source

## 2019-11-24 DIAGNOSIS — J309 Allergic rhinitis, unspecified: Secondary | ICD-10-CM | POA: Diagnosis not present

## 2019-12-01 ENCOUNTER — Ambulatory Visit (INDEPENDENT_AMBULATORY_CARE_PROVIDER_SITE_OTHER): Payer: No Typology Code available for payment source

## 2019-12-01 DIAGNOSIS — J309 Allergic rhinitis, unspecified: Secondary | ICD-10-CM

## 2019-12-11 ENCOUNTER — Encounter: Payer: Self-pay | Admitting: Allergy

## 2019-12-11 ENCOUNTER — Ambulatory Visit (INDEPENDENT_AMBULATORY_CARE_PROVIDER_SITE_OTHER): Payer: No Typology Code available for payment source

## 2019-12-11 DIAGNOSIS — J309 Allergic rhinitis, unspecified: Secondary | ICD-10-CM | POA: Diagnosis not present

## 2019-12-19 ENCOUNTER — Ambulatory Visit (INDEPENDENT_AMBULATORY_CARE_PROVIDER_SITE_OTHER): Payer: No Typology Code available for payment source

## 2019-12-19 DIAGNOSIS — J309 Allergic rhinitis, unspecified: Secondary | ICD-10-CM

## 2020-01-05 ENCOUNTER — Encounter: Payer: Self-pay | Admitting: Allergy

## 2020-01-05 ENCOUNTER — Ambulatory Visit (INDEPENDENT_AMBULATORY_CARE_PROVIDER_SITE_OTHER): Payer: No Typology Code available for payment source

## 2020-01-05 DIAGNOSIS — J309 Allergic rhinitis, unspecified: Secondary | ICD-10-CM | POA: Diagnosis not present

## 2020-01-18 ENCOUNTER — Ambulatory Visit (INDEPENDENT_AMBULATORY_CARE_PROVIDER_SITE_OTHER): Payer: No Typology Code available for payment source

## 2020-01-18 DIAGNOSIS — J309 Allergic rhinitis, unspecified: Secondary | ICD-10-CM | POA: Diagnosis not present

## 2020-01-19 ENCOUNTER — Ambulatory Visit: Payer: No Typology Code available for payment source | Admitting: Family Medicine

## 2020-01-19 NOTE — Progress Notes (Deleted)
   [address] Amanda Blackburn [place] [address] Dept: [phone]  FOLLOW UP NOTE  Patient ID: Amanda Blackburn, female    DOB: 2000/07/15  Age: 19 y.o. MRN: [id number] Date of Office Visit: 01/19/2020  Assessment  Chief Complaint: No chief complaint on file.  HPI Amanda Blackburn    Drug Allergies:  Allergies  Allergen Reactions  . Nickel Rash    Rash   . Sulfa Antibiotics Rash    Rash     Physical Exam: There were no vitals taken for this visit.   Physical Exam  Diagnostics:    Assessment and Plan: No diagnosis found.  No orders of the defined types were placed in this encounter.   There are no Patient Instructions on file for this visit.  No follow-ups on file.    Thank you for the opportunity to care for this patient.  Please do not hesitate to contact me with questions.  Thermon Leyland, FNP [facility]

## 2020-01-27 ENCOUNTER — Encounter: Payer: Self-pay | Admitting: Internal Medicine

## 2020-01-29 ENCOUNTER — Telehealth: Payer: Self-pay | Admitting: Internal Medicine

## 2020-01-29 NOTE — Telephone Encounter (Signed)
Ok to schedule  Any covid sxs?   covid screening I believe diarrhea is on the list so she will need to be screening for covid or virtual  She has had covid before    Please sch this week if able   Thanks TMS

## 2020-01-31 ENCOUNTER — Ambulatory Visit (INDEPENDENT_AMBULATORY_CARE_PROVIDER_SITE_OTHER): Payer: No Typology Code available for payment source | Admitting: *Deleted

## 2020-01-31 DIAGNOSIS — J309 Allergic rhinitis, unspecified: Secondary | ICD-10-CM

## 2020-01-31 MED FILL — MONTELUKAST SOD 10 MG TAB: 10 | 30 days supply | Qty: 30 | Fill #0

## 2020-01-31 NOTE — Telephone Encounter (Signed)
Patient scheduled for this Friday 02/02/20 at 11:30

## 2020-02-02 ENCOUNTER — Ambulatory Visit: Payer: No Typology Code available for payment source | Admitting: Internal Medicine

## 2020-02-05 MED FILL — BLISOVI FE 1/20 1-20 MG-MCG: 1-20 | 84 days supply | Qty: 112 | Fill #1

## 2020-02-16 ENCOUNTER — Encounter: Payer: Self-pay | Admitting: Allergy

## 2020-02-16 ENCOUNTER — Ambulatory Visit (INDEPENDENT_AMBULATORY_CARE_PROVIDER_SITE_OTHER): Payer: No Typology Code available for payment source | Admitting: *Deleted

## 2020-02-16 DIAGNOSIS — J309 Allergic rhinitis, unspecified: Secondary | ICD-10-CM

## 2020-02-23 ENCOUNTER — Ambulatory Visit (INDEPENDENT_AMBULATORY_CARE_PROVIDER_SITE_OTHER): Payer: No Typology Code available for payment source

## 2020-02-23 DIAGNOSIS — J309 Allergic rhinitis, unspecified: Secondary | ICD-10-CM

## 2020-03-01 ENCOUNTER — Ambulatory Visit: Payer: Self-pay | Admitting: *Deleted

## 2020-03-05 ENCOUNTER — Encounter (HOSPITAL_COMMUNITY): Payer: Self-pay

## 2020-03-05 ENCOUNTER — Emergency Department (HOSPITAL_COMMUNITY): Payer: No Typology Code available for payment source

## 2020-03-05 ENCOUNTER — Emergency Department (HOSPITAL_COMMUNITY)
Admission: EM | Admit: 2020-03-05 | Discharge: 2020-03-06 | Disposition: A | Discharge status: Home / Self Care | Payer: No Typology Code available for payment source | Attending: Emergency Medicine | Admitting: Emergency Medicine

## 2020-03-05 ENCOUNTER — Ambulatory Visit: Payer: No Typology Code available for payment source | Admitting: Internal Medicine

## 2020-03-05 DIAGNOSIS — Y92410 Unspecified street and highway as the place of occurrence of the external cause: Secondary | ICD-10-CM | POA: Insufficient documentation

## 2020-03-05 DIAGNOSIS — Y9389 Activity, other specified: Secondary | ICD-10-CM | POA: Insufficient documentation

## 2020-03-05 DIAGNOSIS — M25561 Pain in right knee: Secondary | ICD-10-CM | POA: Insufficient documentation

## 2020-03-05 DIAGNOSIS — M21931 Unspecified acquired deformity of right forearm: Secondary | ICD-10-CM | POA: Insufficient documentation

## 2020-03-05 DIAGNOSIS — W2210XA Striking against or struck by unspecified automobile airbag, initial encounter: Secondary | ICD-10-CM | POA: Insufficient documentation

## 2020-03-05 DIAGNOSIS — Y999 Unspecified external cause status: Secondary | ICD-10-CM | POA: Diagnosis not present

## 2020-03-05 DIAGNOSIS — M542 Cervicalgia: Secondary | ICD-10-CM | POA: Insufficient documentation

## 2020-03-05 DIAGNOSIS — Z5321 Procedure and treatment not carried out due to patient leaving prior to being seen by health care provider: Secondary | ICD-10-CM | POA: Insufficient documentation
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
## 2020-03-06 ENCOUNTER — Ambulatory Visit (HOSPITAL_COMMUNITY)
Admission: EM | Admit: 2020-03-06 | Discharge: 2020-03-06 | Disposition: A | Discharge status: Home / Self Care | Payer: No Typology Code available for payment source | Attending: Urgent Care | Admitting: Urgent Care

## 2020-03-06 ENCOUNTER — Other Ambulatory Visit: Payer: Self-pay

## 2020-03-06 ENCOUNTER — Encounter (HOSPITAL_COMMUNITY): Payer: Self-pay | Admitting: Emergency Medicine

## 2020-03-06 DIAGNOSIS — M542 Cervicalgia: Secondary | ICD-10-CM

## 2020-03-06 DIAGNOSIS — S50312A Abrasion of left elbow, initial encounter: Secondary | ICD-10-CM

## 2020-03-06 DIAGNOSIS — M25561 Pain in right knee: Secondary | ICD-10-CM

## 2020-03-06 DIAGNOSIS — S161XXA Strain of muscle, fascia and tendon at neck level, initial encounter: Secondary | ICD-10-CM

## 2020-03-06 DIAGNOSIS — M25522 Pain in left elbow: Secondary | ICD-10-CM

## 2020-03-06 DIAGNOSIS — M25531 Pain in right wrist: Secondary | ICD-10-CM

## 2020-03-06 MED ORDER — TIZANIDINE HCL 4 MG PO TABS: 4.0000 mg | ORAL_TABLET | Freq: Three times a day (TID) | ORAL | 0 refills | Status: DC | PRN

## 2020-03-06 MED ORDER — NAPROXEN 500 MG PO TABS
500.0000 mg | ORAL_TABLET | Freq: Two times a day (BID) | ORAL | 0 refills | Status: DC
Start: 2020-03-06 — End: 2020-05-14

## 2020-03-06 MED ORDER — BACITRACIN ZINC 500 UNIT/GM EX OINT
TOPICAL_OINTMENT | CUTANEOUS | Status: AC
  Filled 2020-03-06: qty 28.35

## 2020-03-06 MED FILL — tiZANidine HCL 4 MG TABS: 4 | 10 days supply | Qty: 30 | Fill #0

## 2020-03-06 MED FILL — NAPROXEN 500 MG TABS: 500 | 15 days supply | Qty: 30 | Fill #0
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
## 2020-03-10 ENCOUNTER — Encounter (HOSPITAL_COMMUNITY): Payer: Self-pay

## 2020-03-10 ENCOUNTER — Other Ambulatory Visit: Payer: Self-pay

## 2020-03-10 ENCOUNTER — Ambulatory Visit (HOSPITAL_COMMUNITY)
Admission: EM | Admit: 2020-03-10 | Discharge: 2020-03-10 | Disposition: A | Discharge status: Home / Self Care | Payer: No Typology Code available for payment source | Attending: Urgent Care | Admitting: Urgent Care

## 2020-03-10 DIAGNOSIS — S8011XA Contusion of right lower leg, initial encounter: Secondary | ICD-10-CM

## 2020-03-10 DIAGNOSIS — M7989 Other specified soft tissue disorders: Secondary | ICD-10-CM

## 2020-03-10 DIAGNOSIS — M79661 Pain in right lower leg: Secondary | ICD-10-CM

## 2020-03-10 MED ORDER — TRAMADOL HCL 50 MG PO TABS: 50.0000 mg | ORAL_TABLET | Freq: Four times a day (QID) | ORAL | 0 refills | Status: DC | PRN
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
## 2020-03-10 NOTE — Discharge Instructions (Addendum)
Please start wearing compression socks especially for the right lower leg to help you circulate fluid out.  You can use Ace wrap as we planned at your last visit.  Remember to schedule naproxen twice daily still for pain and inflammation.  For breakthrough pain that is not controlled by naproxen you can use tramadol.  Make sure you follow-up with your PCP for an extension of your time off from work.

## 2020-03-14 ENCOUNTER — Other Ambulatory Visit: Payer: Self-pay

## 2020-03-14 ENCOUNTER — Ambulatory Visit (INDEPENDENT_AMBULATORY_CARE_PROVIDER_SITE_OTHER): Payer: No Typology Code available for payment source

## 2020-03-14 ENCOUNTER — Encounter: Payer: Self-pay | Admitting: Nurse Practitioner

## 2020-03-14 ENCOUNTER — Ambulatory Visit (INDEPENDENT_AMBULATORY_CARE_PROVIDER_SITE_OTHER): Payer: No Typology Code available for payment source | Admitting: Nurse Practitioner

## 2020-03-14 ENCOUNTER — Encounter: Payer: Self-pay | Admitting: Internal Medicine

## 2020-03-14 VITALS — BP 98/60 | HR 61 | Temp 98.1°F | Ht 64.0 in | Wt 165.0 lb

## 2020-03-14 DIAGNOSIS — S8011XD Contusion of right lower leg, subsequent encounter: Secondary | ICD-10-CM | POA: Diagnosis not present

## 2020-03-14 DIAGNOSIS — M79641 Pain in right hand: Secondary | ICD-10-CM | POA: Diagnosis not present

## 2020-03-14 DIAGNOSIS — S8011XA Contusion of right lower leg, initial encounter: Secondary | ICD-10-CM | POA: Insufficient documentation

## 2020-03-14 NOTE — Progress Notes (Signed)
Called spoke with Patient DPR (mother) patient has been having a pain level in right wrist rated at an 8 on verbal pain scale 0 to 8. Advised patient DPR I would ask NP for note for patient work until Monday. 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 Patient mother was given information for [facility] if symptoms worsen or do not improve.

## 2020-03-14 NOTE — Patient Instructions (Addendum)
Please go to x-ray today for your right hand pain. We will call you with the x-ray results. I suspect you have a contusion or bruise to the right hand, we will rule out anything more significant such as a fracture.   Continue to rest, ice, and compression ace as recommended by the ER.  Continue with your naproxen, Tylenol as needed. You have tramadol for bedtime. Wrist splint supplied. Wear as needed during daytime.   Follow-up with Dr. French Ana Mclean-Scocuzza in 2 weeks if not improved. Hand Pain Many things can cause hand pain. Some common causes are:  An injury.  Repeating the same movement with your hand over and over (overuse).  Osteoporosis.  Arthritis.  Lumps in the tendons or joints of the hand and wrist (ganglion cysts).  Nerve compression syndromes (carpal tunnel syndrome).  Inflammation of the tendons (tendinitis).  Infection. Follow these instructions at home: Pay attention to any changes in your symptoms. Take these actions to help with your discomfort: Managing pain, stiffness, and swelling   Take over-the-counter and prescription medicines only as told by your health care provider.  Wear a hand splint or support as told by your health care provider.  If directed, put ice on the affected area: ? Put ice in a plastic bag. ? Place a towel between your skin and the bag. ? Leave the ice on for 20 minutes, 2-3 times a day. Activity  Take breaks from repetitive activity often.  Avoid activities that make your pain worse.  Minimize stress on your hands and wrists as much as possible.  Do stretches or exercises as told by your health care provider.  Do not do activities that make your pain worse. Contact a health care provider if:  Your pain does not get better after a few days of self-care.  Your pain gets worse.  Your pain affects your ability to do your daily activities. Get help right away if:  Your hand becomes warm, red, or swollen.  Your hand is  numb or tingling.  Your hand is extremely swollen or deformed.  Your hand or fingers turn white or blue.  You cannot move your hand, wrist, or fingers. Summary  Many things can cause hand pain.  Contact your health care provider if your pain does not get better after a few days of self care.  Minimize stress on your hands and wrists as much as possible.  Do not do activities that make your pain worse. This information is not intended to replace advice given to you by your health care provider. Make sure you discuss any questions you have with your health care provider. Document Revised: 03/11/2018 Document Reviewed: 03/11/2018 Elsevier Patient Education  2020 [employer].
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
## 2020-03-20 ENCOUNTER — Ambulatory Visit: Payer: Self-pay

## 2020-03-25 ENCOUNTER — Ambulatory Visit (INDEPENDENT_AMBULATORY_CARE_PROVIDER_SITE_OTHER): Payer: No Typology Code available for payment source | Admitting: *Deleted

## 2020-03-25 DIAGNOSIS — J309 Allergic rhinitis, unspecified: Secondary | ICD-10-CM | POA: Diagnosis not present

## 2020-03-26 ENCOUNTER — Telehealth: Payer: Self-pay | Admitting: *Deleted

## 2020-03-26 NOTE — Telephone Encounter (Signed)
Patient came in today complaining of her Right arm hurting in the shoulder/upper muscle area and she believes it was from her allergy injection. Her shot was given about 6 inches from where she is having the pain. 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 Patient was asking if she could get a note to miss work or if she could be seen with a provider. I advised that we could see if we could get her in to be seen and she stated that she did not have the time. I recommended that she take a stronger dose of Ibuprofen every 6 hours and apply ice to the sore area. Upon observation there was no localized reaction around the injection site. Patient verbalized understanding.

## 2020-03-28 ENCOUNTER — Ambulatory Visit: Payer: No Typology Code available for payment source | Admitting: Internal Medicine

## 2020-04-05 ENCOUNTER — Other Ambulatory Visit (HOSPITAL_COMMUNITY): Payer: Self-pay | Admitting: Orthopaedic Surgery

## 2020-04-05 MED FILL — GABAPENTIN 300 MG CAPSULE: 300 | 30 days supply | Qty: 30 | Fill #0

## 2020-04-05 MED FILL — MELOXICAM 15 MG TABLET: 15 | 30 days supply | Qty: 30 | Fill #0

## 2020-04-08 ENCOUNTER — Encounter: Payer: Self-pay | Admitting: Internal Medicine

## 2020-04-08 ENCOUNTER — Other Ambulatory Visit (HOSPITAL_COMMUNITY): Payer: Self-pay | Admitting: General Surgery

## 2020-04-08 MED FILL — TRETINOIN 0.025% CREAM: 0.025 | 30 days supply | Qty: 45 | Fill #0

## 2020-04-15 NOTE — Addendum Note (Signed)
Addended by: Quentin Ore on: 04/15/2020 05:15 PM   Modules accepted: Orders

## 2020-04-19 ENCOUNTER — Ambulatory Visit: Payer: Self-pay | Admitting: *Deleted

## 2020-04-24 ENCOUNTER — Ambulatory Visit (INDEPENDENT_AMBULATORY_CARE_PROVIDER_SITE_OTHER): Payer: No Typology Code available for payment source | Admitting: *Deleted

## 2020-04-24 ENCOUNTER — Encounter: Payer: Self-pay | Admitting: Allergy

## 2020-04-24 DIAGNOSIS — J309 Allergic rhinitis, unspecified: Secondary | ICD-10-CM | POA: Diagnosis not present

## 2020-05-01 ENCOUNTER — Other Ambulatory Visit: Payer: Self-pay

## 2020-05-01 ENCOUNTER — Telehealth: Payer: No Typology Code available for payment source | Admitting: Internal Medicine

## 2020-05-14 ENCOUNTER — Other Ambulatory Visit (HOSPITAL_COMMUNITY): Payer: Self-pay | Admitting: Internal Medicine

## 2020-05-14 ENCOUNTER — Encounter: Payer: No Typology Code available for payment source | Admitting: Nurse Practitioner

## 2020-05-14 ENCOUNTER — Other Ambulatory Visit: Payer: Self-pay

## 2020-05-14 ENCOUNTER — Ambulatory Visit
Admission: EM | Admit: 2020-05-14 | Discharge: 2020-05-14 | Disposition: A | Discharge status: Home / Self Care | Payer: No Typology Code available for payment source | Attending: Internal Medicine | Admitting: Internal Medicine

## 2020-05-14 DIAGNOSIS — J069 Acute upper respiratory infection, unspecified: Secondary | ICD-10-CM | POA: Diagnosis not present

## 2020-05-14 MED ORDER — ALBUTEROL SULFATE HFA 108 (90 BASE) MCG/ACT IN AERS: 2.0000 | INHALATION_SPRAY | RESPIRATORY_TRACT | 0 refills | Status: DC | PRN

## 2020-05-14 MED ORDER — BENZONATATE 200 MG PO CAPS: 200.0000 mg | ORAL_CAPSULE | Freq: Three times a day (TID) | ORAL | 0 refills | Status: DC | PRN

## 2020-05-14 MED FILL — BENZONATATE 200 MG CAP: 200 | 10 days supply | Qty: 30 | Fill #0

## 2020-05-14 MED FILL — ALBUTEROL SULFATE HFA 108 (: 108 (90 BAS | 16 days supply | Qty: 18 | Fill #0

## 2020-05-14 NOTE — ED Triage Notes (Signed)
Pt c/o cough, sore throat, nasal congestion, and swollen lymph nodes since yesterday. States had a covid test done yesterday at school with no results at this time.
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
## 2020-05-14 NOTE — Discharge Instructions (Addendum)
Due to your chronic conditions and if your covid test is positive, you qualify to receive Monoclonal Antibody infusion and if you don't hear from anyone within 24 hours of finding your results, call [phone]   Do saline nose rinses twice a day for the post nasal drainage. Use your inhaler every 4-6 hour for the next 5 days.   Things you can do to boost your immune syster to fight viral respiratory infections are the following.  If your Covid test ends up positive you may take the following supplements to help your immune system be stronger to fight this viral infection Take Quarcetin 500 mg three times a day x 7 days with Zinc 50 mg ones a day x 7 days. The quarcetin is an antiviral and anti-inflammatory supplement which helps open the zinc channels in the cell to absorb Zinc. Zinc helps decrease the virus load in your body. Take Melatonin 6-10 mg at bed time which also helps support your immune system.  Also make sure to take Vit D 5,000 IU per day with a fatty meal and Vit C 5000 mg a day until you are completely better.  Don't lay around, keep active and walk as much as you are able to to prevent worsening of your symptoms.

## 2020-05-20 NOTE — Progress Notes (Signed)
Erroneous

## 2020-05-27 MED FILL — BLISOVI FE 1/20 1-20 MG-MCG: 1-20 | 84 days supply | Qty: 112 | Fill #2

## 2020-06-20 ENCOUNTER — Encounter: Payer: Self-pay | Admitting: Internal Medicine

## 2020-06-20 ENCOUNTER — Other Ambulatory Visit (HOSPITAL_COMMUNITY): Payer: Self-pay | Admitting: Internal Medicine

## 2020-06-20 ENCOUNTER — Ambulatory Visit (INDEPENDENT_AMBULATORY_CARE_PROVIDER_SITE_OTHER): Payer: No Typology Code available for payment source | Admitting: Internal Medicine

## 2020-06-20 ENCOUNTER — Other Ambulatory Visit: Payer: Self-pay

## 2020-06-20 VITALS — BP 100/70 | HR 71 | Temp 98.2°F | Ht 63.19 in | Wt 168.0 lb

## 2020-06-20 DIAGNOSIS — H01131 Eczematous dermatitis of right upper eyelid: Secondary | ICD-10-CM

## 2020-06-20 DIAGNOSIS — E559 Vitamin D deficiency, unspecified: Secondary | ICD-10-CM

## 2020-06-20 DIAGNOSIS — Z1389 Encounter for screening for other disorder: Secondary | ICD-10-CM

## 2020-06-20 DIAGNOSIS — H01135 Eczematous dermatitis of left lower eyelid: Secondary | ICD-10-CM

## 2020-06-20 DIAGNOSIS — H01134 Eczematous dermatitis of left upper eyelid: Secondary | ICD-10-CM

## 2020-06-20 DIAGNOSIS — Z13818 Encounter for screening for other digestive system disorders: Secondary | ICD-10-CM

## 2020-06-20 DIAGNOSIS — Z0000 Encounter for general adult medical examination without abnormal findings: Secondary | ICD-10-CM

## 2020-06-20 DIAGNOSIS — E611 Iron deficiency: Secondary | ICD-10-CM

## 2020-06-20 DIAGNOSIS — F32A Depression, unspecified: Secondary | ICD-10-CM

## 2020-06-20 DIAGNOSIS — F419 Anxiety disorder, unspecified: Secondary | ICD-10-CM

## 2020-06-20 DIAGNOSIS — J069 Acute upper respiratory infection, unspecified: Secondary | ICD-10-CM

## 2020-06-20 DIAGNOSIS — Z9109 Other allergy status, other than to drugs and biological substances: Secondary | ICD-10-CM

## 2020-06-20 DIAGNOSIS — L309 Dermatitis, unspecified: Secondary | ICD-10-CM

## 2020-06-20 DIAGNOSIS — Z124 Encounter for screening for malignant neoplasm of cervix: Secondary | ICD-10-CM

## 2020-06-20 DIAGNOSIS — E538 Deficiency of other specified B group vitamins: Secondary | ICD-10-CM

## 2020-06-20 DIAGNOSIS — H01132 Eczematous dermatitis of right lower eyelid: Secondary | ICD-10-CM

## 2020-06-20 DIAGNOSIS — Z1329 Encounter for screening for other suspected endocrine disorder: Secondary | ICD-10-CM

## 2020-06-20 DIAGNOSIS — J309 Allergic rhinitis, unspecified: Secondary | ICD-10-CM

## 2020-06-20 DIAGNOSIS — Z113 Encounter for screening for infections with a predominantly sexual mode of transmission: Secondary | ICD-10-CM

## 2020-06-20 DIAGNOSIS — Z91048 Other nonmedicinal substance allergy status: Secondary | ICD-10-CM

## 2020-06-20 MED ORDER — ALBUTEROL SULFATE HFA 108 (90 BASE) MCG/ACT IN AERS: 2.0000 | INHALATION_SPRAY | RESPIRATORY_TRACT | 11 refills | Status: DC | PRN

## 2020-06-20 MED ORDER — LEVOCETIRIZINE DIHYDROCHLORIDE 5 MG PO TABS
5.0000 mg | ORAL_TABLET | Freq: Every evening | ORAL | 3 refills | Status: DC
Start: 2020-06-20 — End: 2020-11-05

## 2020-06-20 MED ORDER — MONTELUKAST SODIUM 10 MG PO TABS: 10.0000 mg | ORAL_TABLET | Freq: Every day | ORAL | 3 refills | Status: DC

## 2020-06-20 MED ORDER — OLOPATADINE HCL 0.1 % OP SOLN: 1.0000 [drp] | Freq: Every day | OPHTHALMIC | 11 refills | Status: DC | PRN

## 2020-06-20 MED ORDER — IBUPROFEN 400 MG PO TABS: 400.0000 mg | ORAL_TABLET | Freq: Four times a day (QID) | ORAL | 0 refills | Status: DC | PRN

## 2020-06-20 MED ORDER — QVAR REDIHALER 40 MCG/ACT IN AERB
2.0000 | INHALATION_SPRAY | Freq: Two times a day (BID) | RESPIRATORY_TRACT | 11 refills | Status: DC
Start: 2020-06-20 — End: 2021-05-21

## 2020-06-20 MED ORDER — FLUOXETINE HCL 10 MG PO CAPS
10.0000 mg | ORAL_CAPSULE | Freq: Every day | ORAL | 3 refills | Status: DC
Start: 2020-06-20 — End: 2020-08-20

## 2020-06-20 MED ORDER — IPRATROPIUM BROMIDE 0.06 % NA SOLN: 2.0000 | Freq: Four times a day (QID) | NASAL | 12 refills | Status: DC

## 2020-06-20 MED ORDER — DESONIDE 0.05 % EX CREA: TOPICAL_CREAM | Freq: Two times a day (BID) | CUTANEOUS | 11 refills | Status: DC

## 2020-06-20 MED ORDER — EPINEPHRINE 0.3 MG/0.3ML IJ SOAJ
0.3000 mg | INTRAMUSCULAR | 1 refills | Status: DC | PRN
Start: 2020-06-20 — End: 2020-11-05

## 2020-06-20 MED ORDER — FLUTICASONE PROPIONATE 50 MCG/ACT NA SUSP: 2.0000 | Freq: Every day | NASAL | 11 refills | Status: DC

## 2020-06-20 MED FILL — FLUTICASONE PROP 50 MCG SPR: 50 | 30 days supply | Qty: 16 | Fill #0

## 2020-06-20 MED FILL — IPRATROPIUM 0.06% SPRAY: 0.06 | 21 days supply | Qty: 15 | Fill #0

## 2020-06-20 MED FILL — LEVOCETIRIZINE 5 MG TABLET: 5 | 90 days supply | Qty: 90 | Fill #0

## 2020-06-20 MED FILL — EPINEPHRINE 0.3 MG AUTO-INJ: 0.3 | 30 days supply | Qty: 2 | Fill #0

## 2020-06-20 MED FILL — ALBUTEROL SULFATE HFA 108 (: 108 (90 BAS | 16 days supply | Qty: 18 | Fill #0

## 2020-06-20 MED FILL — IBUPROFEN 400 MG TABS: 400 | 7 days supply | Qty: 30 | Fill #0

## 2020-06-20 MED FILL — DESONIDE 0.05% CREAM: 0.05 | 15 days supply | Qty: 60 | Fill #0

## 2020-06-20 MED FILL — FLUoxetine HCL 10 MG CAPS: 10 | 90 days supply | Qty: 90 | Fill #0

## 2020-06-20 MED FILL — OLOPATADINE HCL 0.1 % SOLN: 0.1 | 50 days supply | Qty: 5 | Fill #0

## 2020-06-20 MED FILL — MONTELUKAST SOD 10 MG TAB: 10 | 90 days supply | Qty: 90 | Fill #0

## 2020-06-20 NOTE — Progress Notes (Signed)
Chief Complaint  Patient presents with  . Annual Exam   Annual 1. Declines flu shot disc covid vaccines had rxn with covid vaccine disc with allergist covid vaccine   Review of Systems  Constitutional: Negative for weight loss.  HENT: Negative for hearing loss.   Eyes: Negative for blurred vision.  Respiratory: Negative for shortness of breath.   Cardiovascular: Negative for chest pain.  Gastrointestinal: Positive for abdominal pain.       +ab cramps  Genitourinary: Negative for dysuria.  Musculoskeletal: Negative for falls and joint pain.  Skin: Negative for rash.  Neurological: Negative for dizziness.  Endo/Heme/Allergies: Positive for environmental allergies.  Psychiatric/Behavioral: Negative for depression.   Past Medical History:  Diagnosis Date  . Allergy   . Anxiety   . Asthma   . Depression    sees therapy   . Eczema   . Frequent headaches   . Seasonal allergies    Past Surgical History:  Procedure Laterality Date  . NO PAST SURGERIES     Family History  Problem Relation Age of Onset  . Diabetes Mother   . Asthma Mother   . Hypertension Father   . Asthma Sister    Social History   Socioeconomic History  . Marital status: Single    Spouse name: Not on file  . Number of children: Not on file  . Years of education: Not on file  . Highest education level: Not on file  Occupational History  . Not on file  Tobacco Use  . Smoking status: Never Smoker  . Smokeless tobacco: Never Used  Vaping Use  . Vaping Use: Never used  Substance and Sexual Activity  . Alcohol use: No  . Drug use: No  . Sexual activity: Yes    Birth control/protection: Pill  Other Topics Concern  . 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 Carbinoxamine Maleate (RYVENT) 6 MG TABS Take 1 tablet by mouth 2 (two) times daily.  . [DISCONTINUED] albuterol (PROAIR HFA) 108 (90 Base) MCG/ACT inhaler Inhale 2 puffs into the lungs every 4 (four) hours as needed for wheezing or shortness of breath.  . [DISCONTINUED] Beclomethasone Diprop HFA (QVAR REDIHALER) 40 MCG/ACT AERB Inhale 2 puffs into the lungs 2 (two) times daily. (Patient taking differently: Inhale 2 puffs into the lungs 2 (two) times daily. Uses as needed.)  . [DISCONTINUED] desonide (DESOWEN) 0.05 % cream Apply topically 2 (two) times daily. To eyelids and under eyes as needed  . [DISCONTINUED] EPINEPHrine (AUVI-Q) 0.3 mg/0.3 mL IJ SOAJ injection Inject 0.3 mLs (0.3 mg total) into the muscle as needed for anaphylaxis.  . [DISCONTINUED] FLUoxetine (PROZAC) 10 MG capsule Take 1 capsule (10 mg total) by mouth daily.  . [DISCONTINUED] fluticasone (FLONASE) 50 MCG/ACT nasal spray Place 2 sprays into both nostrils daily.  . [DISCONTINUED]  ibuprofen (ADVIL,MOTRIN) 400 MG tablet Take 1 tablet (400 mg total) by mouth every 6 (six) hours as needed.  . [DISCONTINUED] ipratropium (ATROVENT) 0.06 % nasal spray Place 2 sprays into both nostrils 4 (four) times daily.  . [DISCONTINUED] levocetirizine (XYZAL) 5 MG tablet Take 1 tablet (5 mg total) by mouth every evening.  . [DISCONTINUED] montelukast (SINGULAIR) 10 MG tablet Take 1 tablet (10 mg total) by mouth at bedtime.  . [DISCONTINUED] olopatadine (PATANOL) 0.1 % ophthalmic solution PLACE 1 DROP INTO BOTH EYES DAILY AS NEEDED FOR ALLERGIES.   Allergies  Allergen Reactions  . Nickel Rash    Rash   . Sulfa Antibiotics Rash    Rash    No results found for this or any previous visit  (from the past 2160 hour(s)). Objective  Body mass index is 29.58 kg/m. Wt Readings from Last 3 Encounters:  06/20/20 168 lb (76.2 kg)  03/14/20 165 lb (74.8 kg)  03/10/20 160 lb (72.6 kg)   Temp Readings from Last 3 Encounters:  06/20/20 98.2 F (36.8 C) (Oral)  05/14/20 98.9 F (37.2 C) (Oral)  03/14/20 98.1 F (36.7 C) (Oral)   BP Readings from Last 3 Encounters:  06/20/20 100/70  05/14/20 106/66  03/14/20 98/60   Pulse Readings from Last 3 Encounters:  06/20/20 71  05/14/20 (!) 102  03/14/20 61    Physical Exam Vitals and nursing note reviewed.  Constitutional:      Appearance: Normal appearance. She is well-developed, well-groomed and overweight.  HENT:     Head: Normocephalic and atraumatic.  Eyes:     Conjunctiva/sclera: Conjunctivae normal.     Pupils: Pupils are equal, round, and reactive to light.  Cardiovascular:     Rate and Rhythm: Normal rate and regular rhythm.     Heart sounds: Normal heart sounds. No murmur heard.   Pulmonary:     Effort: Pulmonary effort is normal.     Breath sounds: Normal breath sounds.  Chest:     Chest wall: No mass.  Breasts:     Breasts are asymmetrical.     Right: No axillary adenopathy.     Left: No axillary adenopathy.      Comments: Left breast larger than right   Abdominal:     Tenderness: There is no abdominal tenderness.  Lymphadenopathy:     Upper Body:     Right upper body: No axillary adenopathy.     Left upper body: No axillary adenopathy.  Skin:    General: Skin is warm and dry.  Neurological:     General: No focal deficit present.     Mental Status: She is alert and oriented to person, place, and time. Mental status is at baseline.     Gait: Gait normal.  Psychiatric:        Attention and Perception: Attention and perception normal.        Mood and Affect: Mood and affect normal.        Speech: Speech normal.        Behavior: Behavior normal. Behavior is cooperative.        Thought Content:  Thought content normal.        Cognition and Memory: Cognition and memory normal.        Judgment: Judgment normal.     Assessment  Plan  Annual physical exam -  STD check  Flu shotdeclines  Tdap had 08/18/10 will need in 10 years if not had  HPV vx had  Hep  A/B vx hadbut updated 09/20/2018 and 09/2018 with new Hep B vaccine  -immune 03/30/2019 hep B titerhad 2/2 new hep B vaccine Meningococcal vx had 01/14/18 MMR immune  Never had pap will do age 1  Disc safe sex practices on OCP Disc exercise to lose goal wt for now 130s given ht 5'3" rec responsible choices No etoh or tobacco use  Anxiety and depression - Plan: FLUoxetine (PROZAC) 10 MG capsule  Eczema, unspecified type - Plan: desonide (DESOWEN) 0.05 % cream Eczematous dermatitis of upper and lower eyelids of both eyes - Plan: desonide (DESOWEN) 0.05 % cream  F/u allergist consider dupixent in future allergies, eczema  Disc covid vaccine with allergist   Provider: Dr. Olivia Mackie McLean-Scocuzza-Internal Medicine

## 2020-06-20 NOTE — Patient Instructions (Signed)
Talk to allergist about Dupixent   Dupilumab injection What is this medicine? DUPILUMAB (doo PIL ue mab) is an injection used to treat certain patients with eczema, asthma, and sinus inflammation with nasal polyps. This medicine may be used for other purposes; ask your health care provider or pharmacist if you have questions. COMMON BRAND NAME(S): DUPIXENT What should I tell my health care provider before I take this medicine? They need to know if you have any of these conditions:  asthma  parasitic (helminth) infection  an unusual or allergic reaction to dupilumab, other medicines, foods, dyes, or preservatives  pregnant or trying to get pregnant  breast-feeding How should I use this medicine? This medicine is for injection under the skin. You will be taught how to prepare and give this medicine. Use exactly as directed. Take your medicine at regular intervals. Do not take your medicine more often than directed. It is important that you put your used needles and syringes in a special sharps container. Do not put them in a trash can. If you do not have a sharps container, call your pharmacist or healthcare provider to get one. Talk to your pediatrician regarding the use of this medicine in children. While this medicine may be prescribed for children as young as 6 years for selected conditions, precautions do apply. Overdosage: If you think you have taken too much of this medicine contact a poison control center or emergency room at once. NOTE: This medicine is only for you. Do not share this medicine with others. What if I miss a dose? If you miss a dose, take it as soon as you can if it is within 7 days from the missed dose. If it is more than 7 days from your last dose and you are on an every other week dosing schedule, skip the dose and wait to take the next dose on the original schedule. If it is more than 7 days from your last dose and you are on an every 4-week dosing schedule, take a  dose as soon as possible and start a new schedule based on this date. Do not take double or extra doses. What may interact with this medicine?  vaccines  warfarin This list may not describe all possible interactions. Give your health care provider a list of all the medicines, herbs, non-prescription drugs, or dietary supplements you use. Also tell them if you smoke, drink alcohol, or use illegal drugs. Some items may interact with your medicine. What should I watch for while using this medicine? Tell your doctor or healthcare professional if your symptoms do not start to get better or if they get worse. You should not receive certain vaccines while using this medicine. Dupilumab may prevent a vaccine from working. Talk to your health care provider if you need to receive a vaccine while using this medicine. What side effects may I notice from receiving this medicine? Side effects that you should report to your doctor or health care professional as soon as possible:  allergic reactions like skin rash, itching or hives, swelling of the face, lips, or tongue  changes in vision  eye pain or swelling Side effects that usually do not require medical attention (report these to your doctor or health care professional if they continue or are bothersome):  cold sores  dry or itching eyes  pain, redness, or irritation at site where injected This list may not describe all possible side effects. Call your doctor for medical advice about side effects.  You may report side effects to FDA at 1-800-FDA-1088. Where should I keep my medicine? Keep out of the reach of children. Store this medicine in the refrigerator between 2 and 8 degrees C (36 and 46 degrees F) until you are ready to prepare your injection; do not freeze. You may also store at room temperature for up to 14 days if needed. Do not store above 25 degrees C (77 degrees F) or expose to heat. Throw away any unused medicine after the expiration  date. NOTE: This sheet is a summary. It may not cover all possible information. If you have questions about this medicine, talk to your doctor, pharmacist, or health care provider.  2020 Elsevier/Gold Standard (2018-12-19 11:55:26)

## 2020-06-21 LAB — URINALYSIS, ROUTINE W REFLEX MICROSCOPIC
Bilirubin, UA: NEGATIVE
Glucose, UA: NEGATIVE
Ketones, UA: NEGATIVE
Leukocytes,UA: NEGATIVE
Nitrite, UA: NEGATIVE
Protein,UA: NEGATIVE
Specific Gravity, UA: 1.019 (ref 1.005–1.030)
Urobilinogen, Ur: 0.2 mg/dL (ref 0.2–1.0)
pH, UA: 6.5 (ref 5.0–7.5)

## 2020-06-21 LAB — MICROSCOPIC EXAMINATION
Bacteria, UA: NONE SEEN
Casts: NONE SEEN /lpf
WBC, UA: NONE SEEN /hpf (ref 0–5)

## 2020-06-21 LAB — HIV ANTIBODY (ROUTINE TESTING W REFLEX): HIV Screen 4th Generation wRfx: NONREACTIVE

## 2020-06-24 ENCOUNTER — Other Ambulatory Visit (HOSPITAL_COMMUNITY)
Admission: RE | Admit: 2020-06-24 | Discharge: 2020-06-24 | Disposition: A | Discharge status: Home / Self Care | Payer: No Typology Code available for payment source | Source: Ambulatory Visit | Attending: Internal Medicine | Admitting: Internal Medicine

## 2020-06-24 ENCOUNTER — Other Ambulatory Visit: Payer: No Typology Code available for payment source

## 2020-06-24 ENCOUNTER — Other Ambulatory Visit: Payer: Self-pay

## 2020-06-24 DIAGNOSIS — Z113 Encounter for screening for infections with a predominantly sexual mode of transmission: Secondary | ICD-10-CM

## 2020-06-25 LAB — URINE CYTOLOGY ANCILLARY ONLY
Bacterial Vaginitis-Urine: NEGATIVE
Candida Urine: NEGATIVE
Chlamydia: NEGATIVE
Comment: NEGATIVE
Comment: NEGATIVE
Comment: NORMAL
Neisseria Gonorrhea: NEGATIVE
Trichomonas: NEGATIVE

## 2020-06-25 LAB — COMPREHENSIVE METABOLIC PANEL
ALT: 14 IU/L (ref 0–32)
AST: 17 IU/L (ref 0–40)
Albumin/Globulin Ratio: 1.5 (ref 1.2–2.2)
Albumin: 4.2 g/dL (ref 3.9–5.0)
Alkaline Phosphatase: 68 IU/L (ref 42–106)
BUN/Creatinine Ratio: 13 (ref 9–23)
BUN: 8 mg/dL (ref 6–20)
Bilirubin Total: 0.4 mg/dL (ref 0.0–1.2)
CO2: 22 mmol/L (ref 20–29)
Calcium: 9.2 mg/dL (ref 8.7–10.2)
Chloride: 104 mmol/L (ref 96–106)
Creatinine, Ser: 0.64 mg/dL (ref 0.57–1.00)
GFR calc Af Amer: 149 mL/min/{1.73_m2} (ref >59)
GFR calc non Af Amer: 129 mL/min/{1.73_m2} (ref >59)
Globulin, Total: 2.8 g/dL (ref 1.5–4.5)
Glucose: 80 mg/dL (ref 65–99)
Potassium: 4.2 mmol/L (ref 3.5–5.2)
Sodium: 138 mmol/L (ref 134–144)
Total Protein: 7 g/dL (ref 6.0–8.5)

## 2020-06-25 LAB — VITAMIN B12: Vitamin B-12: 770 pg/mL (ref 232–1245)

## 2020-06-25 LAB — CBC WITH DIFFERENTIAL/PLATELET
Basophils Absolute: 0 10*3/uL (ref 0.0–0.2)
Basos: 1 %
EOS (ABSOLUTE): 0.7 10*3/uL — ABNORMAL HIGH (ref 0.0–0.4)
Eos: 16 %
Hematocrit: 41.8 % (ref 34.0–46.6)
Hemoglobin: 13.6 g/dL (ref 11.1–15.9)
Immature Grans (Abs): 0 10*3/uL (ref 0.0–0.1)
Immature Granulocytes: 0 %
Lymphocytes Absolute: 2.1 10*3/uL (ref 0.7–3.1)
Lymphs: 48 %
MCH: 28.1 pg (ref 26.6–33.0)
MCHC: 32.5 g/dL (ref 31.5–35.7)
MCV: 86 fL (ref 79–97)
Monocytes Absolute: 0.2 10*3/uL (ref 0.1–0.9)
Monocytes: 6 %
Neutrophils Absolute: 1.3 10*3/uL — ABNORMAL LOW (ref 1.4–7.0)
Neutrophils: 29 %
Platelets: 314 10*3/uL (ref 150–450)
RBC: 4.84 x10E6/uL (ref 3.77–5.28)
RDW: 12.7 % (ref 11.7–15.4)
WBC: 4.3 10*3/uL (ref 3.4–10.8)

## 2020-06-25 LAB — TSH: TSH: 1.07 u[IU]/mL (ref 0.450–4.500)

## 2020-06-25 LAB — IRON,TIBC AND FERRITIN PANEL
Ferritin: 31 ng/mL (ref 15–150)
Iron Saturation: 24 % (ref 15–55)
Iron: 88 ug/dL (ref 27–159)
Total Iron Binding Capacity: 363 ug/dL (ref 250–450)
UIBC: 275 ug/dL (ref 131–425)

## 2020-06-25 LAB — HSV(HERPES SMPLX)ABS-I+II(IGG+IGM)-BLD
HSV 1 Glycoprotein G Ab, IgG: <0.91 index (ref 0.00–0.90)
HSV 2 IgG, Type Spec: <0.91 index (ref 0.00–0.90)
HSVI/II Comb IgM: <0.91 Ratio (ref 0.00–0.90)

## 2020-06-25 LAB — RPR: RPR Ser Ql: REACTIVE — AB

## 2020-06-25 LAB — HEPATITIS C ANTIBODY: Hep C Virus Ab: <0.1 s/co ratio (ref 0.0–0.9)

## 2020-06-25 LAB — RPR, QUANT+TP ABS (REFLEX)
Rapid Plasma Reagin, Quant: 1:1 {titer} — ABNORMAL HIGH
T Pallidum Abs: NONREACTIVE

## 2020-06-25 LAB — VITAMIN D 25 HYDROXY (VIT D DEFICIENCY, FRACTURES): Vit D, 25-Hydroxy: 18.7 ng/mL — ABNORMAL LOW (ref 30.0–100.0)

## 2020-06-27 ENCOUNTER — Telehealth: Payer: Self-pay | Admitting: Internal Medicine

## 2020-06-27 ENCOUNTER — Other Ambulatory Visit: Payer: Self-pay | Admitting: Internal Medicine

## 2020-06-27 ENCOUNTER — Other Ambulatory Visit (HOSPITAL_COMMUNITY): Payer: Self-pay | Admitting: Internal Medicine

## 2020-06-27 DIAGNOSIS — E559 Vitamin D deficiency, unspecified: Secondary | ICD-10-CM

## 2020-06-27 MED ORDER — CHOLECALCIFEROL 1.25 MG (50000 UT) PO CAPS
50000.0000 [IU] | ORAL_CAPSULE | ORAL | 1 refills | Status: DC
Start: 2020-06-27 — End: 2020-11-05

## 2020-06-27 MED FILL — VITAMIN D3 50,000 UNITS CAP: 1.25 MG | 91 days supply | Qty: 13 | Fill #0

## 2020-06-27 NOTE — Telephone Encounter (Signed)
Advise patient no further w/u false + RPR  Thank you

## 2020-06-27 NOTE — Telephone Encounter (Signed)
No further work up- false positive- thx

## 2020-06-27 NOTE — Telephone Encounter (Signed)
Patient informed and verbalized understanding

## 2020-06-27 NOTE — Telephone Encounter (Signed)
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  Her mom is the same they did have covid is this related on any other labs I should be checking   Thank you

## 2020-07-04 ENCOUNTER — Ambulatory Visit: Payer: Self-pay

## 2020-07-04 ENCOUNTER — Telehealth: Payer: Self-pay | Admitting: Internal Medicine

## 2020-07-04 NOTE — Progress Notes (Signed)
Error

## 2020-07-04 NOTE — Telephone Encounter (Signed)
Prior authorization has been submitted for patient's QVAR Redihaler 40 MCG/ACT  Awaiting approval or denial.

## 2020-07-10 NOTE — Telephone Encounter (Signed)
Prior authorization has been denied.   "his request has not been approved. Based on the information submitted for review, you did not meet our step therapy rule for the requested drug. Our guideline named STANDARD STEP THERAPY requires that you have tried preferred options before receiving coverage for this drug"   Approval requires you to try: Arnuity Ellipta AND Flovent Diskus or Flovent HFA.

## 2020-07-11 NOTE — Telephone Encounter (Signed)
Patient states she has 2 inhalers that should last her. States she is good for now and declines another inhaler being sent in

## 2020-07-11 NOTE — Telephone Encounter (Signed)
Does she want to try  Approval requires you to try: Arnuity Ellipta AND Flovent Diskus or Flovent HFA.

## 2020-07-18 ENCOUNTER — Encounter: Payer: No Typology Code available for payment source | Admitting: Internal Medicine

## 2020-08-06 ENCOUNTER — Encounter: Payer: Self-pay | Admitting: Allergy & Immunology

## 2020-08-06 ENCOUNTER — Encounter: Payer: Self-pay | Admitting: Internal Medicine

## 2020-08-06 ENCOUNTER — Other Ambulatory Visit: Payer: Self-pay

## 2020-08-06 ENCOUNTER — Ambulatory Visit (INDEPENDENT_AMBULATORY_CARE_PROVIDER_SITE_OTHER): Payer: No Typology Code available for payment source | Admitting: Allergy & Immunology

## 2020-08-06 VITALS — BP 102/66 | HR 62 | Resp 18 | Ht 63.0 in | Wt 170.0 lb

## 2020-08-06 DIAGNOSIS — J302 Other seasonal allergic rhinitis: Secondary | ICD-10-CM

## 2020-08-06 DIAGNOSIS — J3089 Other allergic rhinitis: Secondary | ICD-10-CM

## 2020-08-06 DIAGNOSIS — T781XXD Other adverse food reactions, not elsewhere classified, subsequent encounter: Secondary | ICD-10-CM

## 2020-08-06 DIAGNOSIS — J4520 Mild intermittent asthma, uncomplicated: Secondary | ICD-10-CM | POA: Diagnosis not present

## 2020-08-06 NOTE — Patient Instructions (Addendum)
1. Mild intermittent asthma without complication - Lung testing looked great today.  - Continue with montelukast 10mg  daily. - Continue with albuterol 4 puffs every 4-6 hours as needed.   2. Seasonal and perennial allergic rhinitis (grasses, weeds, trees, ragweed, mold, dust mite, cat)  - I will talk to Dr. about the letter.  - Restart allergy shots when you are able to do that.  - Continue with Xyzal 5mg  daily. - Continue with Singulair 10mg  daily.   3. Pollen-food allergy - Continue to avoid all triggering foods. - EpiPen is up to date.   4. Return in about 6 months (around 02/03/2021).    Please inform of any Emergency Department visits, hospitalizations, or changes in symptoms. Call before going to the ED for breathing or allergy symptoms since we might be able to fit you in for a sick visit. Feel free to contact 04/05/2021 anytime with any questions, problems, or concerns.  It was a pleasure to meet you today!  Websites that have reliable patient information: 1. American Academy of Asthma, Allergy, and Immunology: www.aaaai.org 2. Food Allergy Research and Education (FARE): foodallergy.org 3. Mothers of Asthmatics: http://www.asthmacommunitynetwork.org 4. American College of Allergy, Asthma, and Immunology: www.acaai.org   COVID-19 Vaccine Information can be found at: [name] For questions related to vaccine distribution or appointments, please email [email or web address] or call [phone].     "Like" [name] on Facebook and Instagram for our latest updates!       Make sure you are registered to vote! If you have moved or changed any of your contact information, you will need to get this updated before voting!  In some cases, you MAY be able to register to vote online: [email or web address]
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
## 2020-08-06 NOTE — Addendum Note (Signed)
Addended by: Quentin Ore on: 08/06/2020 05:00 PM   Modules accepted: Orders

## 2020-08-07 ENCOUNTER — Encounter: Payer: Self-pay | Admitting: Internal Medicine

## 2020-08-07 ENCOUNTER — Encounter: Payer: Self-pay | Admitting: Allergy & Immunology

## 2020-08-10 ENCOUNTER — Other Ambulatory Visit: Payer: Self-pay

## 2020-08-10 ENCOUNTER — Other Ambulatory Visit (HOSPITAL_COMMUNITY): Payer: Self-pay | Admitting: Family Medicine

## 2020-08-10 ENCOUNTER — Emergency Department
Admission: RE | Admit: 2020-08-10 | Discharge: 2020-08-10 | Disposition: A | Discharge status: Home / Self Care | Payer: No Typology Code available for payment source | Source: Ambulatory Visit

## 2020-08-10 ENCOUNTER — Ambulatory Visit: Payer: Self-pay

## 2020-08-10 VITALS — BP 121/86 | HR 86 | Temp 99.3°F | Resp 18 | Ht 63.0 in | Wt 167.0 lb

## 2020-08-10 DIAGNOSIS — J4520 Mild intermittent asthma, uncomplicated: Secondary | ICD-10-CM

## 2020-08-10 DIAGNOSIS — J310 Chronic rhinitis: Secondary | ICD-10-CM

## 2020-08-10 DIAGNOSIS — L509 Urticaria, unspecified: Secondary | ICD-10-CM

## 2020-08-10 MED ORDER — PREDNISONE 20 MG PO TABS: 20.0000 mg | ORAL_TABLET | Freq: Every day | ORAL | 0 refills | Status: AC

## 2020-08-10 MED ORDER — HYDROXYZINE HCL 25 MG PO TABS: 25.0000 mg | ORAL_TABLET | Freq: Every evening | ORAL | 0 refills | Status: DC | PRN

## 2020-08-10 MED FILL — HYDROXYZINE HCL 25 MG TABS: 25 | 6 days supply | Qty: 12 | Fill #0

## 2020-08-10 MED FILL — predniSONE 20 MG TABS: 20 | 5 days supply | Qty: 5 | Fill #0

## 2020-08-10 NOTE — ED Triage Notes (Addendum)
Pt presents to Urgent Care with c/o hives, nasal congestion/sneezing, and shortness of breath since yesterday. Pt states her apartment is infested w/ mold. No swelling of mouth or throat reported. Pt states she had a negative PCR COVID test last week.

## 2020-08-10 NOTE — Discharge Instructions (Addendum)
Start prednisone 20 mg with food today and take once daily over the next 5 days for itching and hives. For acute itching you may take hydroxyzine 25 mg and may take up to 3 times daily however avoid taking if you will be working as medication can cause severe drowsiness.
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
## 2020-08-19 ENCOUNTER — Encounter: Payer: Self-pay | Admitting: Internal Medicine

## 2020-08-19 NOTE — Addendum Note (Signed)
Addended by: Quentin Ore on: 08/19/2020 07:06 PM   Modules accepted: Orders

## 2020-08-20 ENCOUNTER — Other Ambulatory Visit: Payer: Self-pay

## 2020-08-20 ENCOUNTER — Other Ambulatory Visit (HOSPITAL_COMMUNITY)
Admission: RE | Admit: 2020-08-20 | Discharge: 2020-08-20 | Disposition: A | Discharge status: Home / Self Care | Payer: No Typology Code available for payment source | Source: Ambulatory Visit | Attending: Advanced Practice Midwife | Admitting: Advanced Practice Midwife

## 2020-08-20 ENCOUNTER — Other Ambulatory Visit (HOSPITAL_COMMUNITY): Payer: Self-pay | Admitting: Advanced Practice Midwife

## 2020-08-20 ENCOUNTER — Encounter: Payer: Self-pay | Admitting: Advanced Practice Midwife

## 2020-08-20 ENCOUNTER — Encounter: Payer: Self-pay | Admitting: Obstetrics

## 2020-08-20 ENCOUNTER — Ambulatory Visit (INDEPENDENT_AMBULATORY_CARE_PROVIDER_SITE_OTHER): Payer: No Typology Code available for payment source | Admitting: Advanced Practice Midwife

## 2020-08-20 VITALS — BP 115/74 | HR 90 | Wt 163.0 lb

## 2020-08-20 DIAGNOSIS — Z124 Encounter for screening for malignant neoplasm of cervix: Secondary | ICD-10-CM | POA: Diagnosis present

## 2020-08-20 DIAGNOSIS — Z3009 Encounter for other general counseling and advice on contraception: Secondary | ICD-10-CM

## 2020-08-20 DIAGNOSIS — Z01419 Encounter for gynecological examination (general) (routine) without abnormal findings: Secondary | ICD-10-CM

## 2020-08-20 DIAGNOSIS — Z113 Encounter for screening for infections with a predominantly sexual mode of transmission: Secondary | ICD-10-CM | POA: Insufficient documentation

## 2020-08-20 MED ORDER — CYCLOBENZAPRINE HCL 10 MG PO TABS: ORAL_TABLET | ORAL | 0 refills | Status: DC

## 2020-08-20 MED ORDER — MISOPROSTOL 200 MCG PO TABS: ORAL_TABLET | ORAL | 0 refills | Status: DC

## 2020-08-20 NOTE — Patient Instructions (Signed)

## 2020-08-20 NOTE — Progress Notes (Signed)
Subjective:     Kadelyn Dimascio is a 20 y.o. female here at [facility] for a routine exam.  Current complaints: None.  Personal health questionnaire reviewed: yes.  Do you have a primary care provider? yes Do you feel safe at home? yes  Flowsheet Row Office Visit from 06/20/2020 in [facility]  PHQ-2 Total Score 0      Risk factors for chronic health problems: Smoking: Never Alchohol/how much: None Pt BMI: Body mass index is 28.87 kg/m.   Gynecologic History Patient's last menstrual period was 08/04/2020. Contraception: OCP (estrogen/progesterone) Last Pap: none.  Last mammogram: n/a.  Obstetric History OB History  Gravida Para Term Preterm AB Living  0 0 0 0 0 0  SAB IAB Ectopic Multiple Live Births  0 0 0 0 0     The following portions of the patient's history were reviewed and updated as appropriate: allergies, current medications, past family history, past medical history, past social history, past surgical history and problem list.  Review of Systems Pertinent items noted in HPI and remainder of comprehensive ROS otherwise negative.    Objective:   BP 115/74   Pulse 90   Wt 163 lb (73.9 kg)   LMP 08/04/2020   BMI 28.87 kg/m  VS reviewed, nursing note reviewed,  Constitutional: well developed, well nourished, no distress HEENT: normocephalic CV: normal rate Pulm/chest wall: normal effort Breast Exam: Deferred with low risks and shared decision making, discussed recommendation to start mammogram between 40-50 yo/ exam  Abdomen: soft Neuro: alert and oriented x 3 Skin: warm, dry Psych: affect normal Pelvic exam: Performed: Cervix pink, visually closed, without lesion, scant white creamy discharge, vaginal walls and external genitalia normal Bimanual exam: Cervix 0/long/high, firm, anterior, neg CMT, uterus nontender, nonenlarged, adnexa without tenderness, enlargement, or mass       Assessment/Plan:   1. Screen for STD (sexually  transmitted disease)  - Cervicovaginal ancillary only( [place])  2. Cervical cancer screening  - Cytology - PAP( [place])  3. Encounter for counseling regarding contraception --Discussed contraception in tiered fashion with LARCs presented as most effective forms of birth control.  Discussed benefits/risks of other methods.  Pt has been taking daily OCP x 5 years and is ready for a change.  She desires IUD placement. Reviewed IUD options and pt would like [place] IUD.    --Appt made for 2 weeks for [facility] IUD placement --Premedicate with Flexeril and Cytotec 4 hours before office visit.  - cyclobenzaprine (FLEXERIL) 10 MG tablet; Take 1 tablet 4 hours before your office procedure. Take at the same time as the Cytotec medication.  Dispense: 1 tablet; Refill: 0 - misoprostol (CYTOTEC) 200 MCG tablet; Take 1 tablet (200 mcg) all at one time about 4 hours before your office procedure. Take at the same time as the Flexeril medication.  Dispense: 1 tablet; Refill: 0  4. Well woman exam with routine gynecological exam   Follow up in: 2 weeks or as needed.   Sharen Counter, CNM 4:24 PM

## 2020-08-21 ENCOUNTER — Telehealth: Payer: Self-pay

## 2020-08-21 LAB — CERVICOVAGINAL ANCILLARY ONLY
Chlamydia: NEGATIVE
Comment: NEGATIVE
Comment: NEGATIVE
Comment: NORMAL
Neisseria Gonorrhea: NEGATIVE
Trichomonas: NEGATIVE

## 2020-08-21 MED FILL — miSOPROStol 200 MCG TABS: 200 | 1 days supply | Qty: 1 | Fill #0

## 2020-08-21 MED FILL — CYCLOBENZAPRINE HCL 10 MG T: 10 | 1 days supply | Qty: 1 | Fill #0

## 2020-08-21 NOTE — Telephone Encounter (Signed)
I dont do this I think has to come from therapy or psychiatry

## 2020-08-21 NOTE — Telephone Encounter (Signed)
Please advise 

## 2020-08-21 NOTE — Telephone Encounter (Signed)
Pt wants to know if she can get evaluated for her dog to be an emotional support animal? Pt states that she called about a month ago and no one returned her call. Please advise

## 2020-08-21 NOTE — Telephone Encounter (Signed)
Patient informed and verbalized understanding

## 2020-08-23 LAB — CYTOLOGY - PAP
Adequacy: ABSENT
Comment: NEGATIVE
Diagnosis: UNDETERMINED — AB
High risk HPV: POSITIVE — AB

## 2020-08-26 ENCOUNTER — Telehealth: Payer: Self-pay

## 2020-08-26 NOTE — Telephone Encounter (Signed)
Patient would like for you to call her concerning HPV results. She is really upset.

## 2020-08-27 ENCOUNTER — Telehealth: Payer: Self-pay | Admitting: Advanced Practice Midwife

## 2020-08-27 NOTE — Telephone Encounter (Signed)
Returned call to patient to discuss recent Pap results. 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  Discussed HPV as common, around 80% of people will have HPV at some point in their lives and it usually clears without treatment.  Given patient's young age and Pap of ASCUS only, recommendation is to repeat Pap in 1 year. Questions answered.  Reviewed possibilities with pt, including if Pap is normal next year then she goes to every 3 year screening, and if it is the same, she repeats again in 1 year.  Pt states understanding and will keep upcoming appt with me for IUD insertion.

## 2020-09-02 MED FILL — miSOPROStol 200 MCG TABS: 200 | 1 days supply | Qty: 1 | Fill #0

## 2020-09-02 MED FILL — CYCLOBENZAPRINE HCL 10 MG T: 10 | 1 days supply | Qty: 1 | Fill #0

## 2020-09-09 ENCOUNTER — Encounter: Payer: Self-pay | Admitting: Advanced Practice Midwife

## 2020-09-09 ENCOUNTER — Ambulatory Visit (INDEPENDENT_AMBULATORY_CARE_PROVIDER_SITE_OTHER): Payer: No Typology Code available for payment source | Admitting: Advanced Practice Midwife

## 2020-09-09 ENCOUNTER — Other Ambulatory Visit: Payer: Self-pay

## 2020-09-09 VITALS — BP 116/63 | HR 90 | Wt 167.8 lb

## 2020-09-09 DIAGNOSIS — Z30430 Encounter for insertion of intrauterine contraceptive device: Secondary | ICD-10-CM

## 2020-09-09 DIAGNOSIS — Z975 Presence of (intrauterine) contraceptive device: Secondary | ICD-10-CM

## 2020-09-09 DIAGNOSIS — Z01812 Encounter for preprocedural laboratory examination: Secondary | ICD-10-CM | POA: Diagnosis not present

## 2020-09-09 LAB — POCT URINE PREGNANCY: Preg Test, Ur: NEGATIVE

## 2020-09-09 MED ORDER — LEVONORGESTREL 19.5 MG IU IUD
1.0000 | INTRAUTERINE_SYSTEM | Freq: Once | INTRAUTERINE | Status: AC
  Administered 2020-09-09: 1 via INTRAUTERINE

## 2020-09-09 NOTE — Patient Instructions (Signed)

## 2020-09-09 NOTE — Progress Notes (Signed)
Discuss HPV results Is Kyleena safe for hormonal migraine

## 2020-09-09 NOTE — Progress Notes (Signed)
GYNECOLOGY OFFICE PROCEDURE NOTE  Amanda Blackburn is a 20 y.o. G0P0000 here for [place]iletta IUD insertion. No GYN concerns.  Last pap smear was on 08/20/20 and was abnormal with ASCUS and positive HPV, repeat Pap recommended in 1 year.   Discussed hormonal acne and migraines. These will likely be improved as periods become lighter and less often with IUD.  Pt to f/u with office if acne on face or body worsen.  Pt to f/u with dermatologist as desired.  IUD Insertion Procedure Note Patient identified, informed consent performed, consent signed.   Discussed risks of irregular bleeding, cramping, infection, malpositioning or misplacement of the IUD outside the uterus which may require further procedure such as laparoscopy. Time out was performed.  Urine pregnancy test negative.  Speculum placed in the vagina.  Cervix visualized.  Cleaned with Betadine x 2.  Grasped anteriorly with a single tooth tenaculum.  Unable to insert Kyleena through internal os with initial attempt so plastic dilator used gently, then IUD inserted without complication. Uterus sounded to 5.5 cm.  Kyleena IUD placed per manufacturer's recommendations.  Strings trimmed to 3 cm. Tenaculum was removed, good hemostasis noted.  Patient tolerated procedure well.   Patient was given post-procedure instructions.  She was advised to have backup contraception for one week.  Patient was also asked to check IUD strings periodically and follow up in 4 weeks for IUD check.  Sharen Counter, CNM 2:21 PM

## 2020-09-11 ENCOUNTER — Encounter: Payer: Self-pay | Admitting: Obstetrics and Gynecology

## 2020-09-30 ENCOUNTER — Ambulatory Visit: Payer: No Typology Code available for payment source | Admitting: Advanced Practice Midwife

## 2020-10-01 ENCOUNTER — Ambulatory Visit (INDEPENDENT_AMBULATORY_CARE_PROVIDER_SITE_OTHER): Payer: No Typology Code available for payment source | Admitting: *Deleted

## 2020-10-01 DIAGNOSIS — J309 Allergic rhinitis, unspecified: Secondary | ICD-10-CM

## 2020-10-21 ENCOUNTER — Ambulatory Visit: Payer: No Typology Code available for payment source | Admitting: Advanced Practice Midwife

## 2020-10-22 DIAGNOSIS — J3081 Allergic rhinitis due to animal (cat) (dog) hair and dander: Secondary | ICD-10-CM | POA: Diagnosis not present

## 2020-10-22 NOTE — Progress Notes (Signed)
VIALS EXP 10-22-21 

## 2020-10-23 DIAGNOSIS — J3089 Other allergic rhinitis: Secondary | ICD-10-CM | POA: Diagnosis not present

## 2020-11-05 ENCOUNTER — Encounter: Payer: Self-pay | Admitting: Advanced Practice Midwife

## 2020-11-05 ENCOUNTER — Other Ambulatory Visit: Payer: Self-pay

## 2020-11-05 ENCOUNTER — Ambulatory Visit (INDEPENDENT_AMBULATORY_CARE_PROVIDER_SITE_OTHER): Payer: No Typology Code available for payment source | Admitting: Advanced Practice Midwife

## 2020-11-05 DIAGNOSIS — N946 Dysmenorrhea, unspecified: Secondary | ICD-10-CM | POA: Diagnosis not present

## 2020-11-05 DIAGNOSIS — Z30431 Encounter for routine checking of intrauterine contraceptive device: Secondary | ICD-10-CM | POA: Diagnosis not present

## 2020-11-05 DIAGNOSIS — R87610 Atypical squamous cells of undetermined significance on cytologic smear of cervix (ASC-US): Secondary | ICD-10-CM | POA: Insufficient documentation

## 2020-11-05 DIAGNOSIS — R87810 Cervical high risk human papillomavirus (HPV) DNA test positive: Secondary | ICD-10-CM

## 2020-11-05 NOTE — Patient Instructions (Signed)
Dysmenorrhea Dysmenorrhea refers to cramps caused by the muscles of the uterus tightening (contracting) during a menstrual period. Dysmenorrhea may be mild, or it may be severe enough to interfere with everyday activities for a few days each month. Primary dysmenorrhea is menstrual cramps that last a couple of days when a female starts having menstrual periods or soon after. As a female gets older or has a baby, the cramps will usually lessen or disappear. Secondary dysmenorrhea begins later in life and is caused by a disorder in the reproductive system. It lasts longer, and it may cause more pain than primary dysmenorrhea. The pain may start before the period and last a few days after the period. What are the causes? Dysmenorrhea is usually caused by an underlying problem, such as:  Endometriosis. The tissue that lines the uterus (endometrium) growing outside of the uterus in other areas of the body.  Adenomyosis. Endometrial tissue growing into the muscular walls of the uterus.  Pelvic congestive syndrome. Blood vessels in the pelvis that fill with blood just before the menstrual period.  Overgrowth of cells (polyps) in the endometrium or the lower part of the uterus (cervix).  Uterine prolapse. The uterus dropping down into the vagina due to stretched or weak muscles.  Bladder problems, such as infection or inflammation.  Intestinal problems, such as a tumor or irritable bowel syndrome.  Cancer of the reproductive organs or bladder. Other causes of this condition may result from:  A severely tipped uterus.  A cervix that is closed or has a small opening.  Noncancerous (benign) tumors in the uterus (fibroids).  Pelvic inflammatory disease (PID).  Pelvic scarring (adhesions) from a previous surgery.  An ovarian cyst.  An IUD (intrauterine device). What increases the risk? You are more likely to develop this condition if:  You are younger than 21 years old.  You started  puberty early.  You have irregular or heavy bleeding.  You have never given birth.  You have a family history of dysmenorrhea.  You smoke or use nicotine products.  You have high body weight or a low body weight. What are the signs or symptoms? Symptoms of this condition include:  Cramping, throbbing pain in lower abdomen or lower back, or a feeling of fullness in the lower abdomen.  Periods lasting for longer than 7 days.  Headaches.  Bloating.  Fatigue.  Nausea or vomiting.  Diarrhea or loose stools.  Sweating or dizziness. How is this diagnosed? This condition may be diagnosed based on:  Your symptoms.  Your medical history.  A physical exam.  Blood tests.  A Pap test. This is a test in which cells from the cervix are tested for signs of cancer or infection.  A pregnancy test. You may also have other tests, including:  Imaging tests, such as: ? Ultrasound. ? A procedure to remove and examine a sample of endometrial tissue (dilation and curettage, D&C). ? A procedure to visually examine the inside of:  The uterus (hysteroscopy).  The abdomen or pelvis (laparoscopy).  The bladder (cystoscopy). ? X-rays.  CT scan.  MRI. How is this treated? Treatment depends on the cause of the dysmenorrhea. Treatment may include medicines, such as:  Pain medicines.  Hormone replacement therapy. ? Injections of progesterone to stop the menstrual period. ? Birth control pills that contain the hormone progesterone. ? An IUD that contains the hormone progesterone.  NSAIDs, such as ibuprofen. These may help to stop the production of hormones that cause cramps.  Antidepressant   medicines. Other treatment may include:  Surgery to remove adhesions, endometriosis, ovarian cysts, fibroids, or the entire uterus (hysterectomy).  Endometrial ablation. This is a procedure to destroy the endometrium.  Presacral neurectomy. This is a procedure to cut the nerves in the  bottom of the spine (sacrum) that go to the reproductive organs.  Sacral nerve stimulation. This is a procedure to apply an electric current to nerves in the sacrum.  Exercise and physical therapy.  Meditation, yoga, and acupuncture. Work with your health care provider to determine what treatment or combination of treatments is best for you. Follow these instructions at home: Relieving pain and cramping  If directed, apply heat to your lower back or abdomen when you experience pain or cramps. Use the heat source that your health care provider recommends, such as a moist heat pack or a heating pad. ? Place a towel between your skin and the heat source. ? Leave the heat on for 20-30 minutes. ? Remove the heat if your skin turns bright red. This is especially important if you are unable to feel pain, heat, or cold. You may have a greater risk of getting burned.  Do not sleep with a heating pad on.  Exercise. Activities such as walking, swimming, or biking can help to relieve cramps.  Massage your lower back or abdomen to help relieve pain.   General instructions  Take over-the-counter and prescription medicines only as told by your health care provider.  Ask your health care provider if the medicine prescribed to you requires you to avoid driving or using machinery.  Avoid alcohol and caffeine during and right before your period. These can make cramps worse.  Do not use any products that contain nicotine or tobacco. These products include cigarettes, chewing tobacco, and vaping devices, such as e-cigarettes. If you need help quitting, ask your health care provider.  Keep all follow-up visits. This is important. Contact a health care provider if:  You have pain that gets worse or does not get better with medicine.  You have pain with sex.  You develop nausea or vomiting with your period that is not controlled with medicine. Get help right away if:  You  faint. Summary  Dysmenorrhea refers to cramps caused by the muscles of the uterus tightening (contracting) during a menstrual period.  Dysmenorrhea may be mild, or it may be severe enough to interfere with everyday activities for a few days each month.  Treatment depends on the cause of the dysmenorrhea.  Work with your health care provider to determine what treatment or combination of treatments is best for you. This information is not intended to replace advice given to you by your health care provider. Make sure you discuss any questions you have with your health care provider. Document Revised: 01/31/2020 Document Reviewed: 01/31/2020 Elsevier Patient Education  2021 Elsevier Inc.  

## 2020-11-05 NOTE — Progress Notes (Signed)
   GYNECOLOGY CLINIC PROGRESS NOTE  History:  20 y.o. G0P0000 here at [facility] today for today for IUD string check; Kyleena IUD was placed  09/09/20. No complaints about the IUD, no concerning side effects.  Pt reports improved migraines and less menstrual cramping last month but period was still heavy x 2 days, lighter for 2 more days.    The following portions of the patient's history were reviewed and updated as appropriate: allergies, current medications, past family history, past medical history, past social history, past surgical history and problem list. Last pap smear on 08/20/20 was abnormal with ASCUS, positive HPV, needs repeat Pap in 1 year per ASCCP guidelines.  Review of Systems:  Pertinent items are noted in HPI.   Objective:  Physical Exam Blood pressure 118/69, pulse 89, height 5\' 2"  (1.575 m), weight 165 lb (74.8 kg), last menstrual period 10/17/2020. Gen: NAD Abd: Soft, nontender and nondistended Pelvic: Normal appearing external genitalia; normal appearing vaginal mucosa and cervix.  IUD strings visualized, about 1 cm in length outside cervix.   Assessment & Plan:  Normal IUD check. Patient to keep IUD in place for five years; can come in for removal if she desires pregnancy within the next five years. Routine preventative health maintenance measures emphasized.  --Pt symptoms of migraines, AUB, dysmenorrhea improved but not resolved.  F/U in 3 months with pt in office.  10/19/2020, CNM 11:12 AM

## 2020-11-05 NOTE — Progress Notes (Signed)
Pt presents today for IUD follow up to check strings. Pt had IUD put in on 09/09/20.

## 2020-11-27 ENCOUNTER — Other Ambulatory Visit: Payer: Self-pay | Admitting: Internal Medicine

## 2020-11-27 ENCOUNTER — Other Ambulatory Visit (HOSPITAL_COMMUNITY): Payer: Self-pay

## 2020-11-27 MED ORDER — ALBUTEROL SULFATE HFA 108 (90 BASE) MCG/ACT IN AERS
2.0000 | INHALATION_SPRAY | RESPIRATORY_TRACT | 11 refills | Status: DC | PRN
  Filled 2020-11-27: qty 18, 16d supply, fill #0

## 2020-11-28 ENCOUNTER — Ambulatory Visit (INDEPENDENT_AMBULATORY_CARE_PROVIDER_SITE_OTHER): Payer: No Typology Code available for payment source | Admitting: *Deleted

## 2020-11-28 ENCOUNTER — Other Ambulatory Visit (HOSPITAL_COMMUNITY): Payer: Self-pay

## 2020-11-28 DIAGNOSIS — J309 Allergic rhinitis, unspecified: Secondary | ICD-10-CM

## 2020-12-03 ENCOUNTER — Ambulatory Visit: Payer: No Typology Code available for payment source | Admitting: Allergy & Immunology

## 2021-01-07 ENCOUNTER — Emergency Department
Admission: EM | Admit: 2021-01-07 | Discharge: 2021-01-07 | Disposition: A | Discharge status: Home / Self Care | Payer: No Typology Code available for payment source | Source: Home / Self Care

## 2021-01-07 ENCOUNTER — Emergency Department: Admit: 2021-01-07 | Discharge status: Absent without leave | Payer: Self-pay

## 2021-01-07 ENCOUNTER — Ambulatory Visit (INDEPENDENT_AMBULATORY_CARE_PROVIDER_SITE_OTHER): Payer: No Typology Code available for payment source | Admitting: *Deleted

## 2021-01-07 ENCOUNTER — Other Ambulatory Visit (HOSPITAL_COMMUNITY): Payer: Self-pay

## 2021-01-07 ENCOUNTER — Other Ambulatory Visit: Payer: Self-pay

## 2021-01-07 DIAGNOSIS — R21 Rash and other nonspecific skin eruption: Secondary | ICD-10-CM

## 2021-01-07 DIAGNOSIS — J309 Allergic rhinitis, unspecified: Secondary | ICD-10-CM | POA: Diagnosis not present

## 2021-01-07 MED ORDER — TRIAMCINOLONE ACETONIDE 0.1 % EX CREA
1.0000 "application " | TOPICAL_CREAM | Freq: Two times a day (BID) | CUTANEOUS | 0 refills | Status: DC
  Filled 2021-01-07 – 2021-01-08 (×2): qty 30, 15d supply, fill #0
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
## 2021-01-07 NOTE — Discharge Instructions (Addendum)
Apply Kenalog cream twice daily to affected area.  Keep area clean and dry.  Continue with hypoallergenic soaps and detergents.  Use oral antihistamines including Zyrtec daily to help with symptoms.  If symptoms persist or worsen in any way please return for reevaluation.

## 2021-01-07 NOTE — ED Triage Notes (Signed)
Pt c/o rash that started around July 2 when she started her new job at [employer]. Mostly on elbow area, sometimes itchy. Hydrocortizone cream prn.

## 2021-01-08 ENCOUNTER — Other Ambulatory Visit (HOSPITAL_BASED_OUTPATIENT_CLINIC_OR_DEPARTMENT_OTHER): Payer: Self-pay

## 2021-01-08 ENCOUNTER — Other Ambulatory Visit (HOSPITAL_COMMUNITY): Payer: Self-pay

## 2021-01-17 ENCOUNTER — Ambulatory Visit (INDEPENDENT_AMBULATORY_CARE_PROVIDER_SITE_OTHER): Payer: No Typology Code available for payment source

## 2021-01-17 DIAGNOSIS — J309 Allergic rhinitis, unspecified: Secondary | ICD-10-CM | POA: Diagnosis not present

## 2021-01-28 ENCOUNTER — Ambulatory Visit: Payer: No Typology Code available for payment source | Admitting: Obstetrics

## 2021-01-29 ENCOUNTER — Ambulatory Visit: Payer: No Typology Code available for payment source | Admitting: Obstetrics and Gynecology

## 2021-01-29 ENCOUNTER — Ambulatory Visit (INDEPENDENT_AMBULATORY_CARE_PROVIDER_SITE_OTHER): Payer: No Typology Code available for payment source | Admitting: *Deleted

## 2021-01-29 DIAGNOSIS — J309 Allergic rhinitis, unspecified: Secondary | ICD-10-CM | POA: Diagnosis not present

## 2021-02-11 ENCOUNTER — Encounter: Payer: Self-pay | Admitting: Obstetrics and Gynecology

## 2021-02-11 ENCOUNTER — Other Ambulatory Visit: Payer: Self-pay

## 2021-02-11 ENCOUNTER — Ambulatory Visit (INDEPENDENT_AMBULATORY_CARE_PROVIDER_SITE_OTHER): Payer: No Typology Code available for payment source | Admitting: Obstetrics and Gynecology

## 2021-02-11 VITALS — BP 119/76 | HR 70 | Ht 62.0 in | Wt 157.1 lb

## 2021-02-11 DIAGNOSIS — Z30431 Encounter for routine checking of intrauterine contraceptive device: Secondary | ICD-10-CM

## 2021-02-11 NOTE — Progress Notes (Signed)
Request string trim.

## 2021-02-11 NOTE — Progress Notes (Signed)
20 yo P0 here for IUD check. Patient reports that she can feel the strings at times poking her and her partner reports feeling her strings during intercourse. Patient is otherwise without any complaints. Patient had her IUD inserted in March 2022. She reports a monthly period. She denies any pelvic pain or abnormal discharge  Past Medical History:  Diagnosis Date   Allergy    Anxiety    Asthma    Depression    sees therapy    Eczema    Frequent headaches    Seasonal allergies    .sh Family History  Problem Relation Age of Onset   Asthma Mother    Diabetes Mother    Hypertension Father    Asthma Sister    Social History   Tobacco Use   Smoking status: Never   Smokeless tobacco: Never  Vaping Use   Vaping Use: Never used  Substance Use Topics   Alcohol use: No   Drug use: No   ROS See pertinent in HPI. All other systems reviewed and non contributory Blood pressure 119/76, pulse 70, height 5\' 2"  (1.575 m), weight 157 lb 1.6 oz (71.3 kg), last menstrual period 01/27/2021. GENERAL: Well-developed, well-nourished female in no acute distress.  ABDOMEN: Soft, nontender, nondistended. No organomegaly. PELVIC: Normal external female genitalia. Vagina is pink and rugated.  Normal discharge. Normal appearing cervix with IUD strings extending 0.5 cm from external os.  EXTREMITIES: No cyanosis, clubbing, or edema, 2+ distal pulses.  A/P 20 yo here for IUD check - IUD is in appropriate location - Patient opted not to have IUD strings further trimmed given that they are already very short - Patient with negative STI screening in February and declined testing today - patient with pap smear 07/2020 - RTC prn

## 2021-02-20 ENCOUNTER — Other Ambulatory Visit: Payer: Self-pay | Admitting: *Deleted

## 2021-02-20 ENCOUNTER — Ambulatory Visit (INDEPENDENT_AMBULATORY_CARE_PROVIDER_SITE_OTHER): Payer: No Typology Code available for payment source | Admitting: *Deleted

## 2021-02-20 DIAGNOSIS — J309 Allergic rhinitis, unspecified: Secondary | ICD-10-CM

## 2021-02-27 ENCOUNTER — Ambulatory Visit (INDEPENDENT_AMBULATORY_CARE_PROVIDER_SITE_OTHER): Payer: No Typology Code available for payment source | Admitting: *Deleted

## 2021-02-27 DIAGNOSIS — J309 Allergic rhinitis, unspecified: Secondary | ICD-10-CM | POA: Diagnosis not present

## 2021-03-05 ENCOUNTER — Ambulatory Visit (INDEPENDENT_AMBULATORY_CARE_PROVIDER_SITE_OTHER): Payer: No Typology Code available for payment source

## 2021-03-05 DIAGNOSIS — J309 Allergic rhinitis, unspecified: Secondary | ICD-10-CM | POA: Diagnosis not present

## 2021-03-13 ENCOUNTER — Other Ambulatory Visit (HOSPITAL_COMMUNITY): Payer: Self-pay

## 2021-03-13 ENCOUNTER — Ambulatory Visit (INDEPENDENT_AMBULATORY_CARE_PROVIDER_SITE_OTHER): Payer: No Typology Code available for payment source | Admitting: *Deleted

## 2021-03-13 ENCOUNTER — Telehealth: Payer: No Typology Code available for payment source | Admitting: Emergency Medicine

## 2021-03-13 DIAGNOSIS — H00015 Hordeolum externum left lower eyelid: Secondary | ICD-10-CM

## 2021-03-13 DIAGNOSIS — J309 Allergic rhinitis, unspecified: Secondary | ICD-10-CM

## 2021-03-13 MED ORDER — ERYTHROMYCIN 5 MG/GM OP OINT
1.0000 "application " | TOPICAL_OINTMENT | Freq: Every day | OPHTHALMIC | 0 refills | Status: AC
  Filled 2021-03-13: qty 3.5, 5d supply, fill #0
  Filled 2021-03-13: qty 5, 5d supply, fill #0

## 2021-03-13 NOTE — Progress Notes (Signed)
  E-Visit for Stye   We are sorry that you are not feeling well. Here is how we plan to help!  Based on what you have shared with me it looks like you have a stye.  A stye is an inflammation of the eyelid.  It is often a red, painful lump near the edge of the eyelid that may look like a boil or a pimple.  A stye develops when an infection occurs at the base of an eyelash.   We have made appropriate suggestions for you based upon your presentation: Simple styes can be treated without medical intervention.  Most styes either resolve spontaneously or resolve with simple home treatment by applying warm compresses or heated washcloth to the stye for about 10-15 minutes three to four times a day. This causes the stye to drain and resolve.  It may also be beneficial to use an antibiotic ointment.  I've prescribed one that is safe for the eyes.  You can apply it to your eyelid.  It's ok if it gets in your eye.  HOME CARE:  Wash your hands often! Let the stye open on its own. Don't squeeze or open it. Don't rub your eyes. This can irritate your eyes and let in bacteria.  If you need to touch your eyes, wash your hands first. Don't wear eye makeup or contact lenses until the area has healed.  GET HELP RIGHT AWAY IF:  Your symptoms do not improve. You develop blurred or loss of vision. Your symptoms worsen (increased discharge, pain or redness).   Thank you for choosing an e-visit.  Your e-visit answers were reviewed by a board certified advanced clinical practitioner to complete your personal care plan. Depending upon the condition, your plan could have included both over the counter or prescription medications.  Please review your pharmacy choice. Make sure the pharmacy is open so you can pick up prescription now. If there is a problem, you may contact your provider through [employer] and have the prescription routed to another pharmacy.  Your safety is important to [name]. If you have drug  allergies check your prescription carefully.   For the next 24 hours you can use MyChart to ask questions about today's visit, request a non-urgent call back, or ask for a work or school excuse. You will get an email in the next two days asking about your experience. I hope that your e-visit has been valuable and will speed your recovery.  Approximately 5 minutes was used in reviewing the patient's chart, questionnaire, prescribing medications, and documentation.

## 2021-03-19 ENCOUNTER — Ambulatory Visit (INDEPENDENT_AMBULATORY_CARE_PROVIDER_SITE_OTHER): Payer: No Typology Code available for payment source

## 2021-03-19 DIAGNOSIS — J309 Allergic rhinitis, unspecified: Secondary | ICD-10-CM | POA: Diagnosis not present

## 2021-03-31 ENCOUNTER — Ambulatory Visit (INDEPENDENT_AMBULATORY_CARE_PROVIDER_SITE_OTHER): Payer: No Typology Code available for payment source

## 2021-03-31 ENCOUNTER — Encounter: Payer: Self-pay | Admitting: Family Medicine

## 2021-03-31 ENCOUNTER — Ambulatory Visit: Payer: No Typology Code available for payment source

## 2021-03-31 ENCOUNTER — Telehealth: Payer: No Typology Code available for payment source | Admitting: Family Medicine

## 2021-03-31 ENCOUNTER — Other Ambulatory Visit (HOSPITAL_COMMUNITY): Payer: Self-pay

## 2021-03-31 DIAGNOSIS — J309 Allergic rhinitis, unspecified: Secondary | ICD-10-CM | POA: Diagnosis not present

## 2021-03-31 DIAGNOSIS — L731 Pseudofolliculitis barbae: Secondary | ICD-10-CM

## 2021-03-31 MED ORDER — MUPIROCIN 2 % EX OINT
1.0000 "application " | TOPICAL_OINTMENT | Freq: Two times a day (BID) | CUTANEOUS | 0 refills | Status: DC
  Filled 2021-03-31: qty 22, 11d supply, fill #0
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
## 2021-03-31 NOTE — Patient Instructions (Signed)
I appreciate the opportunity to provide you with care for your health and wellness.  Use topical ointment twice daily  If swelling, hardest, or pain or you develop a fever or new symptoms go be seen in person.  Have a wonderful day. With Gratitude, Tereasa Coop, DNP, AGNP-BC

## 2021-04-01 ENCOUNTER — Ambulatory Visit: Payer: No Typology Code available for payment source

## 2021-04-17 ENCOUNTER — Ambulatory Visit (INDEPENDENT_AMBULATORY_CARE_PROVIDER_SITE_OTHER): Payer: No Typology Code available for payment source | Admitting: *Deleted

## 2021-04-17 DIAGNOSIS — J309 Allergic rhinitis, unspecified: Secondary | ICD-10-CM

## 2021-04-23 ENCOUNTER — Other Ambulatory Visit (HOSPITAL_COMMUNITY): Payer: Self-pay

## 2021-04-23 ENCOUNTER — Telehealth: Payer: No Typology Code available for payment source | Admitting: Family Medicine

## 2021-04-23 ENCOUNTER — Telehealth: Payer: No Typology Code available for payment source | Admitting: Physician Assistant

## 2021-04-23 DIAGNOSIS — J069 Acute upper respiratory infection, unspecified: Secondary | ICD-10-CM

## 2021-04-23 DIAGNOSIS — B9689 Other specified bacterial agents as the cause of diseases classified elsewhere: Secondary | ICD-10-CM | POA: Diagnosis not present

## 2021-04-23 MED ORDER — BENZONATATE 100 MG PO CAPS
100.0000 mg | ORAL_CAPSULE | Freq: Three times a day (TID) | ORAL | 0 refills | Status: DC | PRN
  Filled 2021-04-23: qty 30, 10d supply, fill #0

## 2021-04-23 MED ORDER — AZITHROMYCIN 250 MG PO TABS
ORAL_TABLET | ORAL | 0 refills | Status: AC
  Filled 2021-04-23: qty 6, 5d supply, fill #0

## 2021-04-23 NOTE — Progress Notes (Signed)
I have spent 5 minutes in review of e-visit questionnaire, review and updating patient chart, medical decision making and response to patient.   Casten Floren Cody Danial Sisley, PA-C    

## 2021-04-23 NOTE — Progress Notes (Signed)
We are sorry that you are not feeling well.  Here is how we plan to help!  Based on your presentation I believe you most likely have A cough due to bacteria.  When patients have a fever and a productive cough with a change in color or increased sputum production, we are concerned about bacterial bronchitis.  If left untreated it can progress to pneumonia.  If your symptoms do not improve with your treatment plan it is important that you contact your provider.   I have prescribed Azithromyin 250 mg: two tablets now and then one tablet daily for 4 additonal days    In addition you may use A prescription cough medication called Tessalon Perles 100mg . You may take 1-2 capsules every 8 hours as needed for your cough.  I see you submitted a video visit for this issue later this morning. Please let me know if we need to still keep that or can remove it since we are treating via e-visit.  From your responses in the eVisit questionnaire you describe inflammation in the upper respiratory tract which is causing a significant cough.  This is commonly called Bronchitis and has four common causes:   Allergies Viral Infections Acid Reflux Bacterial Infection Allergies, viruses and acid reflux are treated by controlling symptoms or eliminating the cause. An example might be a cough caused by taking certain blood pressure medications. You stop the cough by changing the medication. Another example might be a cough caused by acid reflux. Controlling the reflux helps control the cough.  USE OF BRONCHODILATOR ("RESCUE") INHALERS: There is a risk from using your bronchodilator too frequently.  The risk is that over-reliance on a medication which only relaxes the muscles surrounding the breathing tubes can reduce the effectiveness of medications prescribed to reduce swelling and congestion of the tubes themselves.  Although you feel brief relief from the bronchodilator inhaler, your asthma may actually be worsening with  the tubes becoming more swollen and filled with mucus.  This can delay other crucial treatments, such as oral steroid medications. If you need to use a bronchodilator inhaler daily, several times per day, you should discuss this with your provider.  There are probably better treatments that could be used to keep your asthma under control.     HOME CARE Only take medications as instructed by your medical team. Complete the entire course of an antibiotic. Drink plenty of fluids and get plenty of rest. Avoid close contacts especially the very young and the elderly Cover your mouth if you cough or cough into your sleeve. Always remember to wash your hands A steam or ultrasonic humidifier can help congestion.   GET HELP RIGHT AWAY IF: You develop worsening fever. You become short of breath You cough up blood. Your symptoms persist after you have completed your treatment plan MAKE SURE YOU  Understand these instructions. Will watch your condition. Will get help right away if you are not doing well or get worse.    Thank you for choosing an e-visit.  Your e-visit answers were reviewed by a board certified advanced clinical practitioner to complete your personal care plan. Depending upon the condition, your plan could have included both over the counter or prescription medications.  Please review your pharmacy choice. Make sure the pharmacy is open so you can pick up prescription now. If there is a problem, you may contact your provider through and have the prescription routed to another pharmacy.  Your safety is important to  [name]. If you have drug allergies check your prescription carefully.   For the next 24 hours you can use MyChart to ask questions about today's visit, request a non-urgent call back, or ask for a work or school excuse. You will get an email in the next two days asking about your experience. I hope that your e-visit has been valuable and will speed your  recovery.

## 2021-04-23 NOTE — Progress Notes (Signed)
[place] Did evisit, no need for VV

## 2021-04-28 ENCOUNTER — Telehealth: Payer: No Typology Code available for payment source | Admitting: Family Medicine

## 2021-04-28 ENCOUNTER — Telehealth: Payer: Self-pay | Admitting: Internal Medicine

## 2021-04-28 DIAGNOSIS — J069 Acute upper respiratory infection, unspecified: Secondary | ICD-10-CM

## 2021-04-28 NOTE — Telephone Encounter (Signed)
Patient was informed by access nurse to treat at home and over the counter medications for a common cold. Patient also had a virtual visit today to be evaluated and treated.

## 2021-04-28 NOTE — Telephone Encounter (Signed)
Did she test for covid ? Rec this and where was she evaluated call them back and let them know she is not better  Also if we have acute visit and needed schedule this please?    Give If needing prescription strength medication we will need to make an appointment with a provider.  These are over the counter medication options:  Mucinex dm green label for cough.  Vitamin C 1000 mg daily.  Vitamin D3 4000 Iu (units) daily.  Zinc 100 mg daily.  Quercetin 250-500 mg 2 times per day   Elderberry  Oil of oregano  cepacol or chloroseptic spray  Warm tea with honey and lemon  Hydration  Try to eat though you dont feel like it   Tylenol or Advil  Nasal saline  Flonase     Monitor pulse oximeter, buy from [facility] if oxygen is less than 90 please go to the hospital.        Are you feeling really sick? Shortness of breath, cough, chest pain?, dizziness? Confusion   If so let me know  If worsening, go to hospital or [facility] clinic Urgent care for further treatment.

## 2021-04-28 NOTE — Telephone Encounter (Signed)
Patient is calling in describing symptoms of cough and congestion. Patient is losing her voice and said she has a lot of mucus. She has been prescribed two medications but they are not working and she is feeling worse.

## 2021-04-28 NOTE — Progress Notes (Signed)
Amanda Blackburn  Failed Med Course with [facility]  Needs in person eval.

## 2021-04-29 ENCOUNTER — Ambulatory Visit: Payer: No Typology Code available for payment source

## 2021-04-29 NOTE — Telephone Encounter (Signed)
Patient was tested for Covid according to the notes of her e-visit. Patient was advised via e-visit that she needs to be evaluated in person via an urgent care.

## 2021-05-02 ENCOUNTER — Ambulatory Visit (INDEPENDENT_AMBULATORY_CARE_PROVIDER_SITE_OTHER): Payer: No Typology Code available for payment source

## 2021-05-02 DIAGNOSIS — J309 Allergic rhinitis, unspecified: Secondary | ICD-10-CM

## 2021-05-20 ENCOUNTER — Other Ambulatory Visit: Payer: Self-pay

## 2021-05-20 ENCOUNTER — Other Ambulatory Visit (HOSPITAL_COMMUNITY): Payer: Self-pay

## 2021-05-20 ENCOUNTER — Encounter: Payer: Self-pay | Admitting: Internal Medicine

## 2021-05-20 ENCOUNTER — Ambulatory Visit (INDEPENDENT_AMBULATORY_CARE_PROVIDER_SITE_OTHER): Payer: No Typology Code available for payment source | Admitting: Internal Medicine

## 2021-05-20 ENCOUNTER — Other Ambulatory Visit (HOSPITAL_COMMUNITY)
Admission: RE | Admit: 2021-05-20 | Discharge: 2021-05-20 | Disposition: A | Discharge status: Home / Self Care | Payer: No Typology Code available for payment source | Source: Ambulatory Visit | Attending: Internal Medicine | Admitting: Internal Medicine

## 2021-05-20 VITALS — BP 122/80 | HR 79 | Temp 97.0°F | Ht 63.15 in | Wt 150.8 lb

## 2021-05-20 DIAGNOSIS — J4520 Mild intermittent asthma, uncomplicated: Secondary | ICD-10-CM

## 2021-05-20 DIAGNOSIS — Z23 Encounter for immunization: Secondary | ICD-10-CM | POA: Diagnosis not present

## 2021-05-20 DIAGNOSIS — J321 Chronic frontal sinusitis: Secondary | ICD-10-CM

## 2021-05-20 DIAGNOSIS — H01131 Eczematous dermatitis of right upper eyelid: Secondary | ICD-10-CM

## 2021-05-20 DIAGNOSIS — R87810 Cervical high risk human papillomavirus (HPV) DNA test positive: Secondary | ICD-10-CM

## 2021-05-20 DIAGNOSIS — Z0000 Encounter for general adult medical examination without abnormal findings: Secondary | ICD-10-CM

## 2021-05-20 DIAGNOSIS — L309 Dermatitis, unspecified: Secondary | ICD-10-CM

## 2021-05-20 DIAGNOSIS — N760 Acute vaginitis: Secondary | ICD-10-CM | POA: Insufficient documentation

## 2021-05-20 DIAGNOSIS — B3731 Acute candidiasis of vulva and vagina: Secondary | ICD-10-CM

## 2021-05-20 DIAGNOSIS — R87610 Atypical squamous cells of undetermined significance on cytologic smear of cervix (ASC-US): Secondary | ICD-10-CM

## 2021-05-20 DIAGNOSIS — Z113 Encounter for screening for infections with a predominantly sexual mode of transmission: Secondary | ICD-10-CM | POA: Diagnosis not present

## 2021-05-20 DIAGNOSIS — H01134 Eczematous dermatitis of left upper eyelid: Secondary | ICD-10-CM

## 2021-05-20 DIAGNOSIS — K649 Unspecified hemorrhoids: Secondary | ICD-10-CM

## 2021-05-20 DIAGNOSIS — E559 Vitamin D deficiency, unspecified: Secondary | ICD-10-CM

## 2021-05-20 DIAGNOSIS — Z1329 Encounter for screening for other suspected endocrine disorder: Secondary | ICD-10-CM

## 2021-05-20 DIAGNOSIS — Z1389 Encounter for screening for other disorder: Secondary | ICD-10-CM

## 2021-05-20 DIAGNOSIS — H01132 Eczematous dermatitis of right lower eyelid: Secondary | ICD-10-CM

## 2021-05-20 DIAGNOSIS — Z13220 Encounter for screening for lipoid disorders: Secondary | ICD-10-CM

## 2021-05-20 DIAGNOSIS — H01135 Eczematous dermatitis of left lower eyelid: Secondary | ICD-10-CM

## 2021-05-20 MED ORDER — LIDOCAINE (ANORECTAL) 5 % EX CREA
1.0000 "application " | TOPICAL_CREAM | Freq: Two times a day (BID) | CUTANEOUS | 11 refills | Status: DC | PRN
  Filled 2021-05-20 (×2): qty 30, 30d supply, fill #0

## 2021-05-20 MED ORDER — TETANUS-DIPHTH-ACELL PERTUSSIS 5-2.5-18.5 LF-MCG/0.5 IM SUSP: 0.5000 mL | Freq: Once | INTRAMUSCULAR | 0 refills | Status: AC

## 2021-05-20 MED ORDER — HYDROCORTISONE (PERIANAL) 2.5 % EX CREA
1.0000 "application " | TOPICAL_CREAM | Freq: Two times a day (BID) | CUTANEOUS | 11 refills | Status: DC
  Filled 2021-05-20: qty 30, 10d supply, fill #0

## 2021-05-20 NOTE — Progress Notes (Signed)
Chief Complaint  Patient presents with   Annual Exam   Labs Only    Patient wanting full panel STD testing for screening purposes.    F/u annual due 06/20/21  1. Ascus pap with hpv 08/20/20 due f/u ob/gyn and established c/o vaginal irritation and she reports she cleans aggressively but uses mild soap and has discharge  2. Eczema needs referral dermatology  3. C/o rectal pain and recent straining with constipation tried otc laxative  4. Wants full std panel with labs 06/20/21 sen tto [facility]    Review of Systems  Constitutional:  Negative for weight loss.  HENT:  Negative for hearing loss.   Eyes:  Negative for blurred vision.  Respiratory:  Negative for shortness of breath.   Cardiovascular:  Negative for chest pain.  Gastrointestinal:  Positive for constipation. Negative for abdominal pain and blood in stool.       Rectal pain  Genitourinary:  Negative for dysuria.       Vag discharge  Musculoskeletal:  Negative for falls and joint pain.  Skin:  Negative for rash.  Neurological:  Negative for headaches.  Psychiatric/Behavioral:  Negative for depression.   Past Medical History:  Diagnosis Date   Allergy    Anxiety    Asthma    Depression    sees therapy    Eczema    Frequent headaches    Seasonal allergies    Past Surgical History:  Procedure Laterality Date   NO PAST SURGERIES     Family History  Problem Relation Age of Onset   Asthma Mother    Diabetes Mother    Hypertension Father    Asthma Sister    Social History   Socioeconomic History   Marital status: Single    Spouse name: Not on file   Number of children: Not on file   Years of education: Not on file   Highest education level: Not on file  Occupational History   Not on file  Tobacco Use   Smoking status: Never   Smokeless tobacco: Never  Vaping Use   Vaping Use: Never used  Substance and Sexual Activity   Alcohol use: No   Drug use: No   Sexual activity: Yes    Birth control/protection:  Pill  Other Topics Concern   Not on file  Social History Narrative   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     Single as of 03/02/2019    No guns    Wears seat belt   Safe in relationship    No etoh or drugs    She wants to be a Engineer, manufacturing systems   Works [employer] in [place] Strain: Not on file  Food Insecurity: Not on file  Transportation Needs: Not on file  Physical Activity: Not on file  Stress: Not on file  Social Connections: Not on file  Intimate Partner Violence: Not on file   Current Meds  Medication Sig   azithromycin (ZITHROMAX) 250 MG tablet Take 2 tablets on day 1, then 1 tablet daily on days 2 through 5 (with food for sinus pressure/INFECTION)   Cholecalciferol (VITAMIN D3) 1.25 MG (50000 UT) CAPS TAKE 1 CAPSULE BY MOUTH ONCE A WEEK. DISCONTINUE AFTER 6 MONTHS AND START 4000 UNIT(S) ONCE DAILY (OTC) AS DIRECTED   desonide (DESOWEN) 0.05 % cream Apply topically 2 (two) times daily. To eyelids and under eyes as needed   EPINEPHrine 0.3 mg/0.3  mL IJ SOAJ injection INJECT 0.3 MG INTO THE MUSCLE AS NEEDED FOR ANAPHYLAXIS.   fluconazole (DIFLUCAN) 150 MG tablet Take 1 tablet (150 mg total) by mouth once for 1 dose. Repeat 2nd dose in 3 days if needed   fluticasone (FLONASE) 50 MCG/ACT nasal spray PLACE 2 SPRAYS INTO BOTH NOSTRILS DAILY.   hydrocortisone (ANUSOL-HC) 2.5 % rectal cream Place 1 application rectally 2 (two) times daily.   ibuprofen (ADVIL) 400 MG tablet TAKE 1 TABLET BY MOUTH EVERY 6 HOURS AS NEEDED   ipratropium (ATROVENT) 0.06 % nasal spray INSTILL 2 SPRAYS INTO BOTH NOSTRILS 4 TIMES DAILY   Lidocaine, Anorectal, 5 % CREA Apply topically 2 times daily as needed.   mupirocin ointment (BACTROBAN) 2 % Apply 1 application topically 2 (two) times daily.   olopatadine (PATANOL) 0.1 % ophthalmic solution PLACE 1 DROP IN BOTH EYES ONCE A DAY AS NEEDED FOR ALLERGIES   [EXPIRED] Tdap (BOOSTRIX) 5-2.5-18.5 LF-MCG/0.5 injection  Inject 0.5 mLs into the muscle once for 1 dose.   triamcinolone cream (KENALOG) 0.1 % Apply 1 application topically 2 (two) times daily.   [DISCONTINUED] albuterol (PROAIR HFA) 108 (90 Base) MCG/ACT inhaler Inhale 2 puffs into the lungs every 4 (four) hours as needed for wheezing or shortness of breath.   [DISCONTINUED] beclomethasone (QVAR REDIHALER) 40 MCG/ACT inhaler Inhale 2 puffs into the lungs 2 (two) times daily. Rinse mouth   [DISCONTINUED] benzonatate (TESSALON) 100 MG capsule Take 1 capsule (100 mg total) by mouth 3 (three) times daily as needed for cough.   [DISCONTINUED] levocetirizine (XYZAL) 5 MG tablet TAKE 1 TABLET BY MOUTH EVERY EVENING.   [DISCONTINUED] montelukast (SINGULAIR) 10 MG tablet TAKE 1 TABLET BY MOUTH AT BEDTIME.   Allergies  Allergen Reactions   Covid-19 (Mrna) Vaccine Diarrhea and Nausea And Vomiting    Fever   Nickel Rash    Rash    Sulfa Antibiotics Rash    Rash    No results found for this or any previous visit (from the past 2160 hour(s)). Objective  Body mass index is 26.59 kg/m. Wt Readings from Last 3 Encounters:  05/20/21 150 lb 12.8 oz (68.4 kg)  02/11/21 157 lb 1.6 oz (71.3 kg)  11/05/20 165 lb (74.8 kg)   Temp Readings from Last 3 Encounters:  05/20/21 (!) 97 F (36.1 C) (Temporal)  01/07/21 98.5 F (36.9 C) (Oral)  08/10/20 99.3 F (37.4 C) (Oral)   BP Readings from Last 3 Encounters:  05/20/21 122/80  02/11/21 119/76  01/07/21 113/64   Pulse Readings from Last 3 Encounters:  05/20/21 79  02/11/21 70  01/07/21 80    Physical Exam Vitals and nursing note reviewed.  Constitutional:      Appearance: Normal appearance. She is well-developed and well-groomed.  HENT:     Head: Normocephalic and atraumatic.  Eyes:     Conjunctiva/sclera: Conjunctivae normal.     Pupils: Pupils are equal, round, and reactive to light.  Cardiovascular:     Rate and Rhythm: Normal rate and regular rhythm.     Heart sounds: Normal heart sounds.  No murmur heard. Pulmonary:     Effort: Pulmonary effort is normal.     Breath sounds: Normal breath sounds.  Chest:     Chest wall: No mass.  Breasts:    Breasts are symmetrical.     Right: Normal.     Left: Normal.  Abdominal:     General: Abdomen is flat. Bowel sounds are normal.  Tenderness: There is no abdominal tenderness.  Genitourinary:    Pubic Area: No rash.      Vagina: Vaginal discharge present.     Rectum: External hemorrhoid present.  Musculoskeletal:        General: No tenderness.  Lymphadenopathy:     Upper Body:     Right upper body: No axillary adenopathy.     Left upper body: No axillary adenopathy.  Skin:    General: Skin is warm and dry.  Neurological:     General: No focal deficit present.     Mental Status: She is alert and oriented to person, place, and time. Mental status is at baseline.     Cranial Nerves: Cranial nerves 2-12 are intact.     Gait: Gait is intact.  Psychiatric:        Attention and Perception: Attention and perception normal.        Mood and Affect: Mood and affect normal.        Speech: Speech normal.        Behavior: Behavior normal. Behavior is cooperative.        Thought Content: Thought content normal.        Cognition and Memory: Cognition and memory normal.        Judgment: Judgment normal.    Assessment  Plan  Atypical squamous cell changes of undetermined significance (ASCUS) on cervical cytology with positive high risk human papilloma virus (HPV) - Plan: Ambulatory referral to Obstetrics / Gynecology  Screening for STD (sexually transmitted disease) - Plan: HIV antibody (with reflex), Hepatitis C antibody, HSV(herpes simplex vrs) 1+2 ab-IgG   Hemorrhoids, unspecified hemorrhoid type - Plan: hydrocortisone (ANUSOL-HC) 2.5 % rectal cream, Lidocaine, Anorectal, 5 % CREA  Acute vaginitis - Plan: Urine cytology ancillary only([place]), fluconazole (DIFLUCAN) 150 MG tablet  Mild intermittent asthma without  complication - Plan: beclomethasone (QVAR REDIHALER) 40 MCG/ACT inhaler, albuterol (PROAIR HFA) 108 (90 Base) MCG/ACT inhaler  Yeast vaginitis - Plan: fluconazole (DIFLUCAN) 150 MG tablet  Frontal sinusitis, unspecified chronicity - Plan: azithromycin (ZITHROMAX) 250 MG tablet  Eczema, unspecified type - Plan: Ambulatory referral to Dermatology  Eczematous dermatitis of upper and lower eyelids of both eyes - Plan: Ambulatory referral to Dermatology  Vitamin D deficiency - Plan: Vitamin D (25 hydroxy)  Annual due 06/20/21 or after  STD check due 06/20/21 with fasting labs  Flu shot declines  Covid 1 declines  Tdap due will get with next labs HPV vx had  Hep A/B vx had but updated 09/20/2018 and 09/2018 with new Hep B vaccine  -immune 03/30/2019 hep B titer had 2/2 new hep B vaccine immune  Meningococcal vx had 01/14/18 MMR immune     Ascus pap 08/20/20 +HPV referred ob/gyn in [facility] health Disc safe sex practices has IUD rec responsible choices and healthy diet and exercise  No etoh or tobacco use    Provider: Dr. Olivia Mackie McLean-Scocuzza-Internal Medicine

## 2021-05-20 NOTE — Patient Instructions (Addendum)
Tdap due   Hemorrhoids consider prep H or Tucks  Hemorrhoids are swollen veins in and around the rectum or anus. There are two types of hemorrhoids: Internal hemorrhoids. These occur in the veins that are just inside the rectum. They may poke through to the outside and become irritated and painful. External hemorrhoids. These occur in the veins that are outside the anus and can be felt as a painful swelling or hard lump near the anus. Most hemorrhoids do not cause serious problems, and they can be managed with home treatments such as diet and lifestyle changes. If home treatments do not help the symptoms, procedures can be done to shrink or remove the hemorrhoids. What are the causes? This condition is caused by increased pressure in the anal area. This pressure may result from various things, including: Constipation. Straining to have a bowel movement. Diarrhea. Pregnancy. Obesity. Sitting for long periods of time. Heavy lifting or other activity that causes you to strain. Anal sex. Riding a bike for a long period of time. What are the signs or symptoms? Symptoms of this condition include: Pain. Anal itching or irritation. Rectal bleeding. Leakage of stool (feces). Anal swelling. One or more lumps around the anus. How is this diagnosed? This condition can often be diagnosed through a visual exam. Other exams or tests may also be done, such as: An exam that involves feeling the rectal area with a gloved hand (digital rectal exam). An exam of the anal canal that is done using a small tube (anoscope). A blood test, if you have lost a significant amount of blood. A test to look inside the colon using a flexible tube with a camera on the end (sigmoidoscopy or colonoscopy). How is this treated? This condition can usually be treated at home. However, various procedures may be done if dietary changes, lifestyle changes, and other home treatments do not help your symptoms. These procedures  can help make the hemorrhoids smaller or remove them completely. Some of these procedures involve surgery, and others do not. Common procedures include: Rubber band ligation. Rubber bands are placed at the base of the hemorrhoids to cut off their blood supply. Sclerotherapy. Medicine is injected into the hemorrhoids to shrink them. Infrared coagulation. A type of light energy is used to get rid of the hemorrhoids. Hemorrhoidectomy surgery. The hemorrhoids are surgically removed, and the veins that supply them are tied off. Stapled hemorrhoidopexy surgery. The surgeon staples the base of the hemorrhoid to the rectal wall. Follow these instructions at home: Eating and drinking  Eat foods that have a lot of fiber in them, such as whole grains, beans, nuts, fruits, and vegetables. Ask your health care provider about taking products that have added fiber (fiber supplements). Reduce the amount of fat in your diet. You can do this by eating low-fat dairy products, eating less red meat, and avoiding processed foods. Drink enough fluid to keep your urine pale yellow. Managing pain and swelling  Take warm sitz baths for 20 minutes, 3-4 times a day to ease pain and discomfort. You may do this in a bathtub or using a portable sitz bath that fits over the toilet. If directed, apply ice to the affected area. Using ice packs between sitz baths may be helpful. Put ice in a plastic bag. Place a towel between your skin and the bag. Leave the ice on for 20 minutes, 2-3 times a day. General instructions Take over-the-counter and prescription medicines only as told by your health care provider.  Use medicated creams or suppositories as told. Get regular exercise. Ask your health care provider how much and what kind of exercise is best for you. In general, you should do moderate exercise for at least 30 minutes on most days of the week (150 minutes each week). This can include activities such as walking, biking, or  yoga. Go to the bathroom when you have the urge to have a bowel movement. Do not wait. Avoid straining to have bowel movements. Keep the anal area dry and clean. Use wet toilet paper or moist towelettes after a bowel movement. Do not sit on the toilet for long periods of time. This increases blood pooling and pain. Keep all follow-up visits as told by your health care provider. This is important. Contact a health care provider if you have: Increasing pain and swelling that are not controlled by treatment or medicine. Difficulty having a bowel movement, or you are unable to have a bowel movement. Pain or inflammation outside the area of the hemorrhoids. Get help right away if you have: Uncontrolled bleeding from your rectum. Summary Hemorrhoids are swollen veins in and around the rectum or anus. Most hemorrhoids can be managed with home treatments such as diet and lifestyle changes. Taking warm sitz baths can help ease pain and discomfort. In severe cases, procedures or surgery can be done to shrink or remove the hemorrhoids. This information is not intended to replace advice given to you by your health care provider. Make sure you discuss any questions you have with your health care provider. Document Revised: 12/25/2020 Document Reviewed: 12/25/2020 Elsevier Patient Education  [place].  Tdap (Tetanus, Diphtheria, Pertussis) Vaccine: What You Need to Know 1. Why get vaccinated? Tdap vaccine can prevent tetanus, diphtheria, and pertussis. Diphtheria and pertussis spread from person to person. Tetanus enters the body through cuts or wounds. TETANUS (T) causes painful stiffening of the muscles. Tetanus can lead to serious health problems, including being unable to open the mouth, having trouble swallowing and breathing, or death. DIPHTHERIA (D) can lead to difficulty breathing, heart failure, paralysis, or death. PERTUSSIS (aP), also known as "whooping cough," can cause  uncontrollable, violent coughing that makes it hard to breathe, eat, or drink. Pertussis can be extremely serious especially in babies and young children, causing pneumonia, convulsions, brain damage, or death. In teens and adults, it can cause weight loss, loss of bladder control, passing out, and rib fractures from severe coughing. 2. Tdap vaccine Tdap is only for children 7 years and older, adolescents, and adults.  Adolescents should receive a single dose of Tdap, preferably at age 49 or 71 years. Pregnant people should get a dose of Tdap during every pregnancy, preferably during the early part of the third trimester, to help protect the newborn from pertussis. Infants are most at risk for severe, life-threatening complications from pertussis. Adults who have never received Tdap should get a dose of Tdap. Also, adults should receive a booster dose of either Tdap or Td (a different vaccine that protects against tetanus and diphtheria but not pertussis) every 10 years, or after 5 years in the case of a severe or dirty wound or burn. Tdap may be given at the same time as other vaccines. 3. Talk with your health care provider Tell your vaccine provider if the person getting the vaccine: Has had an allergic reaction after a previous dose of any vaccine that protects against tetanus, diphtheria, or pertussis, or has any severe, life-threatening allergies Has had a coma,  decreased level of consciousness, or prolonged seizures within 7 days after a previous dose of any pertussis vaccine (DTP, DTaP, or Tdap) Has seizures or another nervous system problem Has ever had Guillain-Barr Syndrome (also called "GBS") Has had severe pain or swelling after a previous dose of any vaccine that protects against tetanus or diphtheria In some cases, your health care provider may decide to postpone Tdap vaccination until a future visit. People with minor illnesses, such as a cold, may be vaccinated. People who are  moderately or severely ill should usually wait until they recover before getting Tdap vaccine.  Your health care provider can give you more information. 4. Risks of a vaccine reaction Pain, redness, or swelling where the shot was given, mild fever, headache, feeling tired, and nausea, vomiting, diarrhea, or stomachache sometimes happen after Tdap vaccination. People sometimes faint after medical procedures, including vaccination. Tell your provider if you feel dizzy or have vision changes or ringing in the ears.  As with any medicine, there is a very remote chance of a vaccine causing a severe allergic reaction, other serious injury, or death. 5. What if there is a serious problem? An allergic reaction could occur after the vaccinated person leaves the clinic. If you see signs of a severe allergic reaction (hives, swelling of the face and throat, difficulty breathing, a fast heartbeat, dizziness, or weakness), call 9-1-1 and get the person to the nearest hospital. For other signs that concern you, call your health care provider.  Adverse reactions should be reported to the Vaccine Adverse Event Reporting System (VAERS). Your health care provider will usually file this report, or you can do it yourself. Visit the VAERS website at www.vaers.SamedayNews.es or call (203)855-7928. VAERS is only for reporting reactions, and VAERS staff members do not give medical advice. 6. The National Vaccine Injury Compensation Program The [employer] Vaccine Injury Compensation Program (VICP) is a federal program that was created to compensate people who may have been injured by certain vaccines. Claims regarding alleged injury or death due to vaccination have a time limit for filing, which may be as short as two years. Visit the VICP website at GoldCloset.com.ee or call 540-406-4098 to learn about the program and about filing a claim. 7. How can I learn more? Ask your health care provider. Call your local or  state health department. Visit the website of the Food and Drug Administration (FDA) for vaccine package inserts and additional information at TraderRating.uy. Contact the Centers for Disease Control and Prevention (CDC): Call 386-025-9913 (1-800-CDC-INFO) or Visit CDC's website at http://hunter.com/. Vaccine Information Statement Tdap (Tetanus, Diphtheria, Pertussis) Vaccine (02/02/2020) This information is not intended to replace advice given to you by your health care provider. Make sure you discuss any questions you have with your health care provider. Document Revised: 02/28/2020 Document Reviewed: 02/28/2020 Elsevier Patient Education  [place].  HPV Test Why am I having this test? HPV (human papillomavirus) refers to a group of over 150 viruses. Many of these viruses cause growths on the surfaces of the skin, including the genitals or along the throat. Most HPV viruses cause infections that usually go away without treatment. The HPV test checks for high-risk types (strains) of HPV. Strains 16 and 18 are considered the most high-risk for cancer. If you are infected with a high-risk strain of HPV, it can increase your risk for cancer of the cervix, vagina, vulva, anus, penis, or throat. HPV can be found in both males and females. However, HPV testing is  only approved for use in females: Who are 50-52 years old. Who have an abnormal Pap test. Who have been treated for an abnormal Pap test in the past. Who have been treated for a high-risk HPV infection in the past. If you are a woman older than 30, you may have the HPV test at the same time as a pelvic exam and Pap test (cotesting). There is a high chance of finding HPV if HPV test, pelvic exam, and Pap test are done at the same time. What is being tested? This test checks for the DNA (genetic) strands of the HPV infection. This test is also called the HPV DNA test. What kind of sample is  taken? This test requires a sample of cells from the cervix. This will be done using a small cotton swab, plastic spatula, or brush. This sample is often collected during a pelvic exam, when you are lying on your back on an exam table with feet in footrests (stirrups). How do I prepare for this test? Starting 24-48 hours before your test, or as told by your health care provider, do not: Take a bath. Have sex. Douche. Schedule the test for a day when you are not menstruating. If you are menstruating on the day of the test, you may need to reschedule. You will be asked to urinate right before the test. How are the results reported? Your test results will be reported as either positive or negative for HPV. What do the results mean? A negative HPV test result means that no HPV was found. This means it is very likely that you do not have HPV. A positive HPV test result means that you have high-risk HPV that can lead to certain types of cancer. It does not mean that you have cancer. Talk with your health care provider about what your results mean. Questions to ask your health care provider Ask your health care provider, or the department that is doing the test: When will my results be ready? How will I get my results? What are my treatment options? What other tests do I need? What are my next steps? Summary The human papillomavirus (HPV) test is used to look for high-risk types of HPV infection. This test is done only for females. HPV types 16 and 18 are considered high-risk types of HPV. If untreated, these types of infections increase your risk for cancer of the cervix, vagina, vulva, anus, penis, or throat. A negative HPV test result means that no HPV was found, and it is very likely that you do not have HPV. A positive HPV test result means that you have an HPV infection. It does not mean that you have cancer. This information is not intended to replace advice given to you by your health  care provider. Make sure you discuss any questions you have with your health care provider. Document Revised: 01/30/2020 Document Reviewed: 01/30/2020 Elsevier Patient Education  [place].

## 2021-05-21 ENCOUNTER — Other Ambulatory Visit (HOSPITAL_COMMUNITY): Payer: Self-pay

## 2021-05-21 ENCOUNTER — Encounter: Payer: Self-pay | Admitting: Internal Medicine

## 2021-05-21 LAB — URINE CYTOLOGY ANCILLARY ONLY
Bacterial Vaginitis-Urine: NEGATIVE
Candida Urine: NEGATIVE
Chlamydia: NEGATIVE
Comment: NEGATIVE
Comment: NEGATIVE
Comment: NORMAL
Neisseria Gonorrhea: NEGATIVE
Trichomonas: NEGATIVE

## 2021-05-21 MED ORDER — QVAR REDIHALER 40 MCG/ACT IN AERB
2.0000 | INHALATION_SPRAY | Freq: Two times a day (BID) | RESPIRATORY_TRACT | 11 refills | Status: DC
  Filled 2021-05-21: qty 10.6, 30d supply, fill #0

## 2021-05-21 MED ORDER — AZITHROMYCIN 250 MG PO TABS
ORAL_TABLET | ORAL | 0 refills | Status: AC
  Filled 2021-05-21: qty 6, 5d supply, fill #0

## 2021-05-21 MED ORDER — FLUCONAZOLE 150 MG PO TABS
150.0000 mg | ORAL_TABLET | Freq: Once | ORAL | 0 refills | Status: AC
  Filled 2021-05-21: qty 2, 3d supply, fill #0

## 2021-05-21 MED ORDER — LEVOCETIRIZINE DIHYDROCHLORIDE 5 MG PO TABS
ORAL_TABLET | Freq: Every evening | ORAL | 3 refills | Status: DC
  Filled 2021-05-21: qty 90, 90d supply, fill #0

## 2021-05-21 MED ORDER — ALBUTEROL SULFATE HFA 108 (90 BASE) MCG/ACT IN AERS
2.0000 | INHALATION_SPRAY | RESPIRATORY_TRACT | 11 refills | Status: DC | PRN
  Filled 2021-05-21: qty 18, 16d supply, fill #0

## 2021-05-21 MED ORDER — MONTELUKAST SODIUM 10 MG PO TABS
ORAL_TABLET | Freq: Every day | ORAL | 3 refills | Status: DC
  Filled 2021-05-21: qty 90, 90d supply, fill #0

## 2021-05-21 NOTE — Telephone Encounter (Signed)
Duplicate message. See other Patient message encounter from same day.

## 2021-05-23 ENCOUNTER — Other Ambulatory Visit (HOSPITAL_COMMUNITY): Payer: Self-pay

## 2021-05-27 ENCOUNTER — Ambulatory Visit (INDEPENDENT_AMBULATORY_CARE_PROVIDER_SITE_OTHER): Payer: No Typology Code available for payment source | Admitting: *Deleted

## 2021-05-27 ENCOUNTER — Other Ambulatory Visit (HOSPITAL_COMMUNITY): Payer: Self-pay

## 2021-05-27 DIAGNOSIS — J309 Allergic rhinitis, unspecified: Secondary | ICD-10-CM

## 2021-05-28 ENCOUNTER — Other Ambulatory Visit (HOSPITAL_COMMUNITY): Payer: Self-pay

## 2021-06-04 ENCOUNTER — Other Ambulatory Visit (HOSPITAL_COMMUNITY): Payer: Self-pay

## 2021-06-05 ENCOUNTER — Other Ambulatory Visit (HOSPITAL_COMMUNITY): Payer: Self-pay

## 2021-06-17 ENCOUNTER — Ambulatory Visit
Admission: EM | Admit: 2021-06-17 | Discharge: 2021-06-17 | Disposition: A | Discharge status: Home / Self Care | Payer: No Typology Code available for payment source | Attending: Physician Assistant | Admitting: Physician Assistant

## 2021-06-17 ENCOUNTER — Encounter: Payer: Self-pay | Admitting: Emergency Medicine

## 2021-06-17 ENCOUNTER — Ambulatory Visit: Payer: No Typology Code available for payment source

## 2021-06-17 ENCOUNTER — Other Ambulatory Visit: Payer: Self-pay

## 2021-06-17 DIAGNOSIS — M25532 Pain in left wrist: Secondary | ICD-10-CM | POA: Diagnosis not present

## 2021-06-17 MED ORDER — PREDNISONE 20 MG PO TABS
40.0000 mg | ORAL_TABLET | Freq: Every day | ORAL | 0 refills | Status: AC
  Filled 2021-06-17: qty 10, 5d supply, fill #0
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
## 2021-06-18 ENCOUNTER — Other Ambulatory Visit (HOSPITAL_COMMUNITY): Payer: Self-pay

## 2021-06-25 ENCOUNTER — Other Ambulatory Visit (HOSPITAL_COMMUNITY): Payer: Self-pay

## 2021-06-28 ENCOUNTER — Emergency Department (HOSPITAL_COMMUNITY)
Admission: EM | Admit: 2021-06-28 | Discharge: 2021-06-28 | Discharge status: Against Medical Advice | Payer: No Typology Code available for payment source | Source: Home / Self Care

## 2021-06-30 ENCOUNTER — Other Ambulatory Visit (HOSPITAL_COMMUNITY): Payer: Self-pay

## 2021-06-30 ENCOUNTER — Ambulatory Visit (HOSPITAL_COMMUNITY)
Admission: EM | Admit: 2021-06-30 | Discharge: 2021-06-30 | Disposition: A | Discharge status: Home / Self Care | Payer: No Typology Code available for payment source | Attending: Family Medicine | Admitting: Family Medicine

## 2021-06-30 ENCOUNTER — Other Ambulatory Visit: Payer: Self-pay

## 2021-06-30 ENCOUNTER — Encounter (HOSPITAL_COMMUNITY): Payer: Self-pay

## 2021-06-30 ENCOUNTER — Ambulatory Visit
Admission: EM | Admit: 2021-06-30 | Disposition: A | Discharge status: Still In Hospital | Payer: No Typology Code available for payment source

## 2021-06-30 DIAGNOSIS — F4322 Adjustment disorder with anxiety: Secondary | ICD-10-CM | POA: Diagnosis not present

## 2021-06-30 MED ORDER — HYDROXYZINE HCL 25 MG PO TABS
25.0000 mg | ORAL_TABLET | Freq: Three times a day (TID) | ORAL | 0 refills | Status: DC | PRN
  Filled 2021-06-30: qty 20, 7d supply, fill #0

## 2021-06-30 NOTE — Discharge Instructions (Signed)
Try hydroxyzine 25 mg 1 tab every 8 hours as needed for anxiety.
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
## 2021-06-30 NOTE — ED Triage Notes (Signed)
Pt presents with complaints of shortness of breath, feeling like her heart rate is elevated, fatigue, and feeling anxious. Pt reports this has occurred the last few days and only when she is alone. Pt has a history of anxiety and feels like this is familiar to that. Pts grandmother did just pass away.

## 2021-07-01 ENCOUNTER — Other Ambulatory Visit: Payer: Self-pay

## 2021-07-01 ENCOUNTER — Encounter: Payer: Self-pay | Admitting: Family

## 2021-07-01 ENCOUNTER — Ambulatory Visit (INDEPENDENT_AMBULATORY_CARE_PROVIDER_SITE_OTHER): Payer: No Typology Code available for payment source | Admitting: Family

## 2021-07-01 ENCOUNTER — Other Ambulatory Visit (HOSPITAL_COMMUNITY): Payer: Self-pay

## 2021-07-01 ENCOUNTER — Ambulatory Visit: Payer: Self-pay

## 2021-07-01 VITALS — BP 98/60 | HR 65 | Temp 98.4°F | Ht 63.15 in | Wt 152.4 lb

## 2021-07-01 DIAGNOSIS — F32A Depression, unspecified: Secondary | ICD-10-CM

## 2021-07-01 DIAGNOSIS — F430 Acute stress reaction: Secondary | ICD-10-CM

## 2021-07-01 DIAGNOSIS — F419 Anxiety disorder, unspecified: Secondary | ICD-10-CM | POA: Diagnosis not present

## 2021-07-01 MED ORDER — HYDROXYZINE HCL 10 MG PO TABS
10.0000 mg | ORAL_TABLET | Freq: Three times a day (TID) | ORAL | 0 refills | Status: DC | PRN
  Filled 2021-07-01: qty 30, 10d supply, fill #0

## 2021-07-01 MED ORDER — BUPROPION HCL ER (XL) 150 MG PO TB24
150.0000 mg | ORAL_TABLET | Freq: Every day | ORAL | 1 refills | Status: DC
  Filled 2021-07-01: qty 30, 30d supply, fill #0
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
## 2021-07-08 ENCOUNTER — Ambulatory Visit: Payer: No Typology Code available for payment source | Admitting: Family

## 2021-08-05 ENCOUNTER — Other Ambulatory Visit: Payer: Self-pay

## 2021-08-05 ENCOUNTER — Other Ambulatory Visit (HOSPITAL_COMMUNITY): Payer: Self-pay

## 2021-08-05 ENCOUNTER — Encounter: Payer: Self-pay | Admitting: Internal Medicine

## 2021-08-05 ENCOUNTER — Telehealth (INDEPENDENT_AMBULATORY_CARE_PROVIDER_SITE_OTHER): Payer: No Typology Code available for payment source | Admitting: Internal Medicine

## 2021-08-05 VITALS — Ht 63.13 in | Wt 147.0 lb

## 2021-08-05 DIAGNOSIS — F419 Anxiety disorder, unspecified: Secondary | ICD-10-CM | POA: Diagnosis not present

## 2021-08-05 DIAGNOSIS — F339 Major depressive disorder, recurrent, unspecified: Secondary | ICD-10-CM

## 2021-08-05 DIAGNOSIS — F4322 Adjustment disorder with anxiety: Secondary | ICD-10-CM

## 2021-08-05 DIAGNOSIS — F410 Panic disorder [episodic paroxysmal anxiety] without agoraphobia: Secondary | ICD-10-CM | POA: Diagnosis not present

## 2021-08-05 MED ORDER — BUPROPION HCL ER (XL) 150 MG PO TB24
150.0000 mg | ORAL_TABLET | Freq: Every day | ORAL | 3 refills | Status: DC
  Filled 2021-08-05 – 2021-08-30 (×2): qty 90, 90d supply, fill #0
  Filled 2021-09-22: qty 90, 90d supply, fill #1

## 2021-08-05 NOTE — Telephone Encounter (Signed)
Patient has spoken with Dr French Ana McLean-Scocuzza via phone.
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
## 2021-08-05 NOTE — Telephone Encounter (Signed)
Patient has spoken with Dr French Ana McLean-Scocuzza via telephone

## 2021-08-13 ENCOUNTER — Other Ambulatory Visit (HOSPITAL_COMMUNITY): Payer: Self-pay

## 2021-08-22 ENCOUNTER — Ambulatory Visit: Payer: No Typology Code available for payment source | Admitting: Obstetrics and Gynecology

## 2021-08-30 ENCOUNTER — Other Ambulatory Visit (HOSPITAL_COMMUNITY): Payer: Self-pay

## 2021-09-20 ENCOUNTER — Other Ambulatory Visit (HOSPITAL_COMMUNITY): Payer: Self-pay

## 2021-09-22 ENCOUNTER — Other Ambulatory Visit (HOSPITAL_COMMUNITY): Payer: Self-pay

## 2021-09-23 ENCOUNTER — Other Ambulatory Visit (HOSPITAL_COMMUNITY): Payer: Self-pay

## 2021-10-02 ENCOUNTER — Ambulatory Visit: Payer: No Typology Code available for payment source | Admitting: Internal Medicine

## 2021-10-10 ENCOUNTER — Ambulatory Visit: Payer: No Typology Code available for payment source | Admitting: Internal Medicine

## 2021-10-22 ENCOUNTER — Other Ambulatory Visit (HOSPITAL_COMMUNITY): Payer: Self-pay

## 2021-10-22 ENCOUNTER — Ambulatory Visit (HOSPITAL_COMMUNITY)
Admission: RE | Admit: 2021-10-22 | Discharge: 2021-10-22 | Disposition: A | Discharge status: Home / Self Care | Payer: No Typology Code available for payment source | Source: Ambulatory Visit | Attending: Family Medicine | Admitting: Family Medicine

## 2021-10-22 ENCOUNTER — Encounter (HOSPITAL_COMMUNITY): Payer: Self-pay

## 2021-10-22 VITALS — BP 107/65 | HR 91 | Temp 98.5°F | Resp 16

## 2021-10-22 DIAGNOSIS — J302 Other seasonal allergic rhinitis: Secondary | ICD-10-CM | POA: Diagnosis not present

## 2021-10-22 DIAGNOSIS — R0981 Nasal congestion: Secondary | ICD-10-CM

## 2021-10-22 DIAGNOSIS — J4521 Mild intermittent asthma with (acute) exacerbation: Secondary | ICD-10-CM | POA: Diagnosis not present

## 2021-10-22 MED ORDER — PROMETHAZINE-DM 6.25-15 MG/5ML PO SYRP
5.0000 mL | ORAL_SOLUTION | Freq: Four times a day (QID) | ORAL | 0 refills | Status: DC | PRN
  Filled 2021-10-22: qty 118, 6d supply, fill #0

## 2021-10-22 MED ORDER — PREDNISONE 20 MG PO TABS
40.0000 mg | ORAL_TABLET | Freq: Every day | ORAL | 0 refills | Status: DC
  Filled 2021-10-22: qty 10, 5d supply, fill #0

## 2021-10-22 NOTE — ED Provider Notes (Signed)
?[facility] ? ? ?[id number] ?10/22/21 Arrival Time: 1300 ? ?ASSESSMENT & PLAN: ? ?1. Nasal congestion   ?2. Seasonal allergies   ?3. Mild intermittent asthma with acute exacerbation   ? ?Has asthma inhalers at home to use prn. ?Discussed typical duration of viral illnesses. ?OTC symptom care as needed. ? ?Discharge Medication List as of 10/22/2021  1:49 PM  ?  ? ?START taking these medications  ? Details  ?predniSONE (DELTASONE) 20 MG tablet Take 2 tablets (40 mg total) by mouth daily., Starting Wed 10/22/2021, Normal  ?  ?promethazine-dextromethorphan (PROMETHAZINE-DM) 6.25-15 MG/5ML syrup Take 5 mLs by mouth 4 (four) times daily as needed for cough., Starting Wed 10/22/2021, Normal  ?  ?  ? ? ? Follow-up Information   ? ? McLean-Scocuzza, Pasty Spillers, MD.   ?Specialty: Internal Medicine ?Why: As needed. ?Contact information: ?[address] ?[place] [address] ?[phone] ? ? ?  ?  ? ?  ?  ? ?  ? ? ?Reviewed expectations re: course of current medical issues. Questions answered. ?Outlined signs and symptoms indicating need for more acute intervention. ?Understanding verbalized. ?After Visit Summary given. ? ? ?SUBJECTIVE: ?History from: Patient. ?Amanda Blackburn is a 21 y.o. female. Reports: ST, nasal congestion, HA; wheezing at times; x few days. Denies: fever. Normal PO intake without n/v/d. Alb inhaler with some relief. ? ?OBJECTIVE: ? ?Vitals:  ? 10/22/21 1315  ?BP: 107/65  ?Pulse: 91  ?Resp: 16  ?Temp: 98.5 ?F (36.9 ?C)  ?TempSrc: Oral  ?SpO2: 99%  ?  ?General appearance: alert; no distress ?Eyes: PERRLA; EOMI; conjunctiva normal ?HENT: [place]; AT; with nasal congestion ?Neck: supple  ?Lungs: speaks full sentences without difficulty; unlabored; mild bilat wheezign ?Extremities: no edema ?Skin: warm and dry ?Neurologic: normal gait ?Psychological: alert and cooperative; normal mood and affect ? ? ?Allergies  ?Allergen Reactions  ? Covid-19 (Mrna) Vaccine Diarrhea and Nausea And Vomiting  ?  Fever  ?  Hydroxyzine   ?  In a daze  ? Prozac [Fluoxetine]   ?  Note effective  ?  ? Nickel Rash  ?  Rash ?  ? Sulfa Antibiotics Rash  ?  Rash ?  ? ? ?Past Medical History:  ?Diagnosis Date  ? Allergy   ? Anxiety   ? Asthma   ? Depression   ? sees therapy   ? Eczema   ? Frequent headaches   ? Seasonal allergies   ? ?Social History  ? ?Socioeconomic History  ? Marital status: Single  ?  Spouse name: Not on file  ? Number of children: Not on file  ? Years of education: Not on file  ? Highest education level: Not on file  ?Occupational History  ? Not on file  ?Tobacco Use  ? Smoking status: Never  ? Smokeless tobacco: Never  ?Vaping Use  ? Vaping Use: Never used  ?Substance and Sexual Activity  ? Alcohol use: No  ? Drug use: No  ? Sexual activity: Yes  ?  Birth control/protection: Pill  ?Other Topics Concern  ? Not on file  ?Social History Narrative  ? 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 Single as of 03/02/2019   ? No guns   ? Wears seat belt  ? Safe in relationship   ? No etoh or drugs   ? 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 Works [employer] in [place]  ? ?Social Determinants of Health  ? ?Financial Resource Strain: Not  on file  ?Food Insecurity: Not on file  ?Transportation Needs: Not on file  ?Physical Activity: Not on file  ?Stress: Not on file  ?Social Connections: Not on file  ?Intimate Partner Violence: Not on file  ? ?Family History  ?Problem Relation Age of Onset  ? Asthma Mother   ? Diabetes Mother   ? Hypertension Father   ? Asthma Sister   ? ?Past Surgical History:  ?Procedure Laterality Date  ? NO PAST SURGERIES    ? ?  ?Mardella Layman, MD ?10/22/21 1500 ? ?

## 2021-10-22 NOTE — ED Triage Notes (Signed)
C/o sore throat,nasal congestion and headache. H/o asthma and allergies. She reports allergies are worse this time of year.  ?
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
## 2021-11-12 ENCOUNTER — Ambulatory Visit: Payer: No Typology Code available for payment source | Admitting: Internal Medicine

## 2021-11-12 ENCOUNTER — Encounter: Payer: Self-pay | Admitting: Internal Medicine

## 2021-11-18 ENCOUNTER — Encounter: Payer: Self-pay | Admitting: Internal Medicine

## 2021-11-18 ENCOUNTER — Ambulatory Visit (INDEPENDENT_AMBULATORY_CARE_PROVIDER_SITE_OTHER): Payer: No Typology Code available for payment source | Admitting: Internal Medicine

## 2021-11-18 VITALS — BP 100/70 | HR 77 | Temp 97.7°F | Resp 14 | Ht 63.13 in | Wt 162.0 lb

## 2021-11-18 DIAGNOSIS — Z13220 Encounter for screening for lipoid disorders: Secondary | ICD-10-CM

## 2021-11-18 DIAGNOSIS — Z113 Encounter for screening for infections with a predominantly sexual mode of transmission: Secondary | ICD-10-CM | POA: Diagnosis not present

## 2021-11-18 DIAGNOSIS — F419 Anxiety disorder, unspecified: Secondary | ICD-10-CM

## 2021-11-18 DIAGNOSIS — Z23 Encounter for immunization: Secondary | ICD-10-CM | POA: Diagnosis not present

## 2021-11-18 DIAGNOSIS — Z0000 Encounter for general adult medical examination without abnormal findings: Secondary | ICD-10-CM

## 2021-11-18 DIAGNOSIS — F633 Trichotillomania: Secondary | ICD-10-CM

## 2021-11-18 DIAGNOSIS — E559 Vitamin D deficiency, unspecified: Secondary | ICD-10-CM | POA: Diagnosis not present

## 2021-11-18 DIAGNOSIS — Z1329 Encounter for screening for other suspected endocrine disorder: Secondary | ICD-10-CM

## 2021-11-18 DIAGNOSIS — F339 Major depressive disorder, recurrent, unspecified: Secondary | ICD-10-CM

## 2021-11-18 DIAGNOSIS — F410 Panic disorder [episodic paroxysmal anxiety] without agoraphobia: Secondary | ICD-10-CM
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
## 2021-11-18 NOTE — Patient Instructions (Addendum)
Wellbutrin Cut pill in 1/2 x 1 week and then take 1/2 of a pill every other day x 1 week then stop    Tdap (Tetanus, Diphtheria, Pertussis) Vaccine: What You Need to Know 1. Why get vaccinated? Tdap vaccine can prevent tetanus, diphtheria, and pertussis. Diphtheria and pertussis spread from person to person. Tetanus enters the body through cuts or wounds. TETANUS (T) causes painful stiffening of the muscles. Tetanus can lead to serious health problems, including being unable to open the mouth, having trouble swallowing and breathing, or death. DIPHTHERIA (D) can lead to difficulty breathing, heart failure, paralysis, or death. PERTUSSIS (aP), also known as "whooping cough," can cause uncontrollable, violent coughing that makes it hard to breathe, eat, or drink. Pertussis can be extremely serious especially in babies and young children, causing pneumonia, convulsions, brain damage, or death. In teens and adults, it can cause weight loss, loss of bladder control, passing out, and rib fractures from severe coughing. 2. Tdap vaccine Tdap is only for children 7 years and older, adolescents, and adults.  Adolescents should receive a single dose of Tdap, preferably at age 48 or 12 years. Pregnant people should get a dose of Tdap during every pregnancy, preferably during the early part of the third trimester, to help protect the newborn from pertussis. Infants are most at risk for severe, life-threatening complications from pertussis. Adults who have never received Tdap should get a dose of Tdap. Also, adults should receive a booster dose of either Tdap or Td (a different vaccine that protects against tetanus and diphtheria but not pertussis) every 10 years, or after 5 years in the case of a severe or dirty wound or burn. Tdap may be given at the same time as other vaccines. 3. Talk with your health care provider Tell your vaccine provider if the person getting the vaccine: Has had an allergic reaction  after a previous dose of any vaccine that protects against tetanus, diphtheria, or pertussis, or has any severe, life-threatening allergies Has had a coma, decreased level of consciousness, or prolonged seizures within 7 days after a previous dose of any pertussis vaccine (DTP, DTaP, or Tdap) Has seizures or another nervous system problem Has ever had Guillain-Barr Syndrome (also called "GBS") Has had severe pain or swelling after a previous dose of any vaccine that protects against tetanus or diphtheria In some cases, your health care provider may decide to postpone Tdap vaccination until a future visit. People with minor illnesses, such as a cold, may be vaccinated. People who are moderately or severely ill should usually wait until they recover before getting Tdap vaccine.  Your health care provider can give you more information. 4. Risks of a vaccine reaction Pain, redness, or swelling where the shot was given, mild fever, headache, feeling tired, and nausea, vomiting, diarrhea, or stomachache sometimes happen after Tdap vaccination. People sometimes faint after medical procedures, including vaccination. Tell your provider if you feel dizzy or have vision changes or ringing in the ears.  As with any medicine, there is a very remote chance of a vaccine causing a severe allergic reaction, other serious injury, or death. 5. What if there is a serious problem? An allergic reaction could occur after the vaccinated person leaves the clinic. If you see signs of a severe allergic reaction (hives, swelling of the face and throat, difficulty breathing, a fast heartbeat, dizziness, or weakness), call 9-1-1 and get the person to the nearest hospital. For other signs that concern you, call your health care  provider.  Adverse reactions should be reported to the Vaccine Adverse Event Reporting System (VAERS). Your health care provider will usually file this report, or you can do it yourself. Visit the VAERS  website at www.vaers.LAgents.no or call 724-624-4839. VAERS is only for reporting reactions, and VAERS staff members do not give medical advice. 6. The National Vaccine Injury Compensation Program The [employer] Vaccine Injury Compensation Program (VICP) is a federal program that was created to compensate people who may have been injured by certain vaccines. Claims regarding alleged injury or death due to vaccination have a time limit for filing, which may be as short as two years. Visit the VICP website at SpiritualWord.at or call 323 028 4815 to learn about the program and about filing a claim. 7. How can I learn more? Ask your health care provider. Call your local or state health department. Visit the website of the Food and Drug Administration (FDA) for vaccine package inserts and additional information at FinderList.no. Contact the Centers for Disease Control and Prevention (CDC): Call (319)499-2467 (1-800-CDC-INFO) or Visit CDC's website at PicCapture.uy. Source: CDC Vaccine Information Statement Tdap (Tetanus, Diphtheria, Pertussis) Vaccine (02/02/2020) This same material is available at FootballExhibition.com.br for no charge. This information is not intended to replace advice given to you by your health care provider. Make sure you discuss any questions you have with your health care provider. Document Revised: 05/14/2021 Document Reviewed: 03/17/2021 Elsevier Patient Education  2023 [employer].

## 2021-11-19 LAB — CBC WITH DIFFERENTIAL/PLATELET
Basophils Absolute: 0 10*3/uL (ref 0.0–0.1)
Basophils Relative: 0.7 % (ref 0.0–3.0)
Eosinophils Absolute: 0.4 10*3/uL (ref 0.0–0.7)
Eosinophils Relative: 8.1 % — ABNORMAL HIGH (ref 0.0–5.0)
HCT: 38.5 % (ref 36.0–46.0)
Hemoglobin: 12.7 g/dL (ref 12.0–15.0)
Lymphocytes Relative: 54.7 % — ABNORMAL HIGH (ref 12.0–46.0)
Lymphs Abs: 2.6 10*3/uL (ref 0.7–4.0)
MCHC: 33 g/dL (ref 30.0–36.0)
MCV: 86.3 fl (ref 78.0–100.0)
Monocytes Absolute: 0.2 10*3/uL (ref 0.1–1.0)
Monocytes Relative: 5 % (ref 3.0–12.0)
Neutro Abs: 1.5 10*3/uL (ref 1.4–7.7)
Neutrophils Relative %: 31.5 % — ABNORMAL LOW (ref 43.0–77.0)
Platelets: 242 10*3/uL (ref 150.0–400.0)
RBC: 4.46 Mil/uL (ref 3.87–5.11)
RDW: 12.4 % (ref 11.5–15.5)
WBC: 4.7 10*3/uL (ref 4.0–10.5)

## 2021-11-19 LAB — COMPREHENSIVE METABOLIC PANEL
ALT: 11 U/L (ref 0–35)
AST: 12 U/L (ref 0–37)
Albumin: 4.2 g/dL (ref 3.5–5.2)
Alkaline Phosphatase: 64 U/L (ref 39–117)
BUN: 14 mg/dL (ref 6–23)
CO2: 27 mEq/L (ref 19–32)
Calcium: 9 mg/dL (ref 8.4–10.5)
Chloride: 104 mEq/L (ref 96–112)
Creatinine, Ser: 0.82 mg/dL (ref 0.40–1.20)
GFR: 101.62 mL/min (ref >60.00)
Glucose, Bld: 87 mg/dL (ref 70–99)
Potassium: 4.3 mEq/L (ref 3.5–5.1)
Sodium: 138 mEq/L (ref 135–145)
Total Bilirubin: 0.3 mg/dL (ref 0.2–1.2)
Total Protein: 6.8 g/dL (ref 6.0–8.3)

## 2021-11-19 LAB — RPR: RPR Ser Ql: NONREACTIVE

## 2021-11-19 LAB — HIV ANTIBODY (ROUTINE TESTING W REFLEX): HIV 1&2 Ab, 4th Generation: NONREACTIVE

## 2021-11-19 LAB — LIPID PANEL
Cholesterol: 146 mg/dL (ref 0–200)
HDL: 56.8 mg/dL (ref >39.00)
LDL Cholesterol: 81 mg/dL (ref 0–99)
NonHDL: 89.1
Total CHOL/HDL Ratio: 3
Triglycerides: 42 mg/dL (ref 0.0–149.0)
VLDL: 8.4 mg/dL (ref 0.0–40.0)

## 2021-11-19 LAB — HEPATITIS C ANTIBODY
Hepatitis C Ab: NONREACTIVE
SIGNAL TO CUT-OFF: 0.11 (ref <1.00)

## 2021-11-19 LAB — TSH: TSH: 1.35 u[IU]/mL (ref 0.35–5.50)

## 2021-11-19 LAB — VITAMIN D 25 HYDROXY (VIT D DEFICIENCY, FRACTURES): VITD: 20.13 ng/mL — ABNORMAL LOW (ref 30.00–100.00)

## 2021-11-19 LAB — HSV(HERPES SIMPLEX VRS) I + II AB-IGG
HAV 1 IGG,TYPE SPECIFIC AB: <0.9 index
HSV 2 IGG,TYPE SPECIFIC AB: <0.9 index

## 2021-12-31 ENCOUNTER — Encounter: Payer: Self-pay | Admitting: Internal Medicine

## 2021-12-31 ENCOUNTER — Telehealth (INDEPENDENT_AMBULATORY_CARE_PROVIDER_SITE_OTHER): Payer: No Typology Code available for payment source | Admitting: Internal Medicine

## 2021-12-31 DIAGNOSIS — H01134 Eczematous dermatitis of left upper eyelid: Secondary | ICD-10-CM

## 2021-12-31 DIAGNOSIS — J309 Allergic rhinitis, unspecified: Secondary | ICD-10-CM

## 2021-12-31 DIAGNOSIS — H01135 Eczematous dermatitis of left lower eyelid: Secondary | ICD-10-CM

## 2021-12-31 DIAGNOSIS — J4520 Mild intermittent asthma, uncomplicated: Secondary | ICD-10-CM

## 2021-12-31 DIAGNOSIS — H01132 Eczematous dermatitis of right lower eyelid: Secondary | ICD-10-CM

## 2021-12-31 DIAGNOSIS — T7840XD Allergy, unspecified, subsequent encounter: Secondary | ICD-10-CM

## 2021-12-31 DIAGNOSIS — L309 Dermatitis, unspecified: Secondary | ICD-10-CM

## 2021-12-31 DIAGNOSIS — N898 Other specified noninflammatory disorders of vagina: Secondary | ICD-10-CM | POA: Diagnosis not present

## 2021-12-31 DIAGNOSIS — H01131 Eczematous dermatitis of right upper eyelid: Secondary | ICD-10-CM

## 2021-12-31 NOTE — Progress Notes (Signed)
Telephone Note  I connected with Boone Master  on 12/31/21 at  2:20 PM EDT by a telephone and verified that I am speaking with the correct person using two identifiers.  Location patient: [place] Location provider:work or home office Persons participating in the virtual visit: patient, provider  I discussed the limitations and requested verbal permission for telemedicine visit. The patient expressed understanding and agreed to proceed.   HPI:  Acute telemedicine visit for : Vaginal cyst x 3 weeks better today ruptured  Eczema flaring wants referral to dermatology and h/o asthma/allergies refer Dr. Neldon Mc consider [place]   -Pertinent past medical history: see below -Pertinent medication allergies: Allergies  Allergen Reactions   Covid-19 (Mrna) Vaccine Diarrhea and Nausea And Vomiting    Fever   Hydroxyzine     In a daze   Prozac [Fluoxetine]     Note effective     Nickel Rash    Rash    Sulfa Antibiotics Rash    Rash    -COVID-19 vaccine status:  Immunization History  Administered Date(s) Administered   DTaP 10/01/1999, 12/02/1999, 01/30/2000, 02/02/2001, 08/03/2003   HPV Quadrivalent 12/03/2011, 02/18/2012, 09/05/2012   Hepatitis A 09/10/2005, 08/04/2006   Hepatitis B 12-19-1999, 09/01/1999, 05/18/2000   Hepb-cpg 09/20/2018, 10/25/2018   HiB (PRP-T) 10/01/1999, 12/02/1999, 01/30/2000, 08/02/2000   IPV 10/01/1999, 12/02/1999, 01/30/2000, 08/03/2003   Influenza,inj,Quad PF,6+ Mos 03/20/2015, 08/07/2016, 04/16/2017, 06/20/2018   Influenza-Unspecified 03/30/2019   MMR 08/02/2000, 08/03/2003   Meningococcal Conjugate 08/18/2010, 08/23/2015, 01/14/2018   PFIZER(Purple Top)SARS-COV-2 Vaccination 10/05/2019   Pneumococcal-Unspecified 10/01/1999, 12/02/1999, 01/30/2000, 08/07/2002   Tdap 08/18/2010, 11/18/2021   Varicella 08/02/2000, 09/10/2005     ROS: See pertinent positives and negatives per HPI.  Past Medical History:  Diagnosis Date   Allergy    Anxiety     Asthma    Depression    sees therapy    Eczema    Frequent headaches    Seasonal allergies     Past Surgical History:  Procedure Laterality Date   NO PAST SURGERIES       Current Outpatient Medications:    albuterol (PROAIR HFA) 108 (90 Base) MCG/ACT inhaler, Inhale 2 puffs into the lungs every 4 (four) hours as needed for wheezing or shortness of breath., Disp: 18 g, Rfl: 11   beclomethasone (QVAR REDIHALER) 40 MCG/ACT inhaler, Inhale 2 puffs into the lungs 2 (two) times daily. Rinse mouth after use., Disp: 10.6 g, Rfl: 11   desonide (DESOWEN) 0.05 % cream, Apply topically 2 (two) times daily. To eyelids and under eyes as needed, Disp: 60 g, Rfl: 11   fluticasone (FLONASE) 50 MCG/ACT nasal spray, PLACE 2 SPRAYS INTO BOTH NOSTRILS DAILY., Disp: 16 g, Rfl: 11   hydrocortisone (ANUSOL-HC) 2.5 % rectal cream, Place 1 application rectally 2 (two) times daily., Disp: 30 g, Rfl: 11   levocetirizine (XYZAL) 5 MG tablet, TAKE 1 TABLET BY MOUTH EVERY EVENING., Disp: 90 tablet, Rfl: 3   Lidocaine, Anorectal, 5 % CREA, Apply topically 2 times daily as needed., Disp: 30 g, Rfl: 11   montelukast (SINGULAIR) 10 MG tablet, TAKE 1 TABLET BY MOUTH AT BEDTIME., Disp: 90 tablet, Rfl: 3   mupirocin ointment (BACTROBAN) 2 %, Apply 1 application topically 2 (two) times daily., Disp: 22 g, Rfl: 0   predniSONE (DELTASONE) 20 MG tablet, Take 2 tablets (40 mg total) by mouth daily., Disp: 10 tablet, Rfl: 0   promethazine-dextromethorphan (PROMETHAZINE-DM) 6.25-15 MG/5ML syrup, Take 5 mLs by mouth 4 (four) times daily as  needed for cough., Disp: 118 mL, Rfl: 0   triamcinolone cream (KENALOG) 0.1 %, Apply 1 application topically 2 (two) times daily., Disp: 30 g, Rfl: 0   buPROPion (WELLBUTRIN XL) 150 MG 24 hr tablet, Take 1 tablet by mouth daily. (Patient not taking: Reported on 11/18/2021), Disp: 90 tablet, Rfl: 3  EXAM:  VITALS per patient if applicable:  GENERAL: alert, oriented, appears well and in no acute  distress   PSYCH/NEURO: pleasant and cooperative, no obvious depression or anxiety, speech and thought processing grossly intact  ASSESSMENT AND PLAN:  Discussed the following assessment and plan:  Vaginal cyst resolved   Eczema- Plan: Ambulatory referral to Dermatology Dr. Lanier Prude  Ambulatory referral to Allergy Dr. Neldon Mc to consider Dr. Maudry Diego Mild intermittent asthma without complication - Plan: Ambulatory referral to Allergy Allergy, subsequent encounter - Plan: Ambulatory referral to Allergy Eczematous dermatitis of upper and lower eyelids of both eyes - Plan: Ambulatory referral to Allergy Allergic rhinitis, unspecified seasonality, unspecified trigger - Plan: Ambulatory referral to Allergy  HM STD check pt wants to do yearly Flu shot declines  Covid 1 declines  Tdap 11/18/21 HPV vx had  Hep A/B vx had but updated 09/20/2018 and 09/2018 with new Hep B vaccine  -immune 03/30/2019 hep B titer had 2/2 new hep B vaccine immune  Meningococcal vx had 01/14/18 MMR immune     Ascus pap 08/20/20 +HPV referred ob/gyn in [facility] Disc safe sex practices has IUD rec responsible choices and healthy diet and exercise  No etoh or tobacco use  Rec healthy and exercise   -we discussed possible serious and likely etiologies, options for evaluation and workup, limitations of telemedicine visit vs in person visit, treatment, treatment risks and precautions. Pt is agreeable to treatment via telemedicine at this moment.     I discussed the assessment and treatment plan with the patient. The patient was provided an opportunity to ask questions and all were answered. The patient agreed with the plan and demonstrated an understanding of the instructions.    Time 11 minutes Delorise Jackson, MD

## 2021-12-31 NOTE — Patient Instructions (Addendum)
Dupilumab Injection What is this medication? DUPILUMAB (doo PIL ue mab) treats some types of skin conditions, such as eczema. It may also be used to treat conditions that cause inflammation in the sinuses and esophagus. It can be used to prevent the symptoms of asthma. It works by decreasing inflammation. Do not use it to treat a sudden asthma attack. This medicine may be used for other purposes; ask your health care provider or pharmacist if you have questions. COMMON BRAND NAME(S): DUPIXENT What should I tell my care team before I take this medication? They need to know if you have any of these conditions: Asthma Eye disease Parasitic (helminth) infection An unusual or allergic reaction to dupilumab, other medicines, foods, dyes, or preservatives Pregnant or trying to get pregnant Breast-feeding How should I use this medication? This medication is injected under the skin. You will be taught how to prepare and give it. Take it as directed on the prescription label. Keep taking it unless your care team tells you to stop. If you use a pen, be sure to take off the outer needle cover before using the dose. It is important that you put your used needles and syringes in a special sharps container. Do not put them in a trash can. If you do not have a sharps container, call your pharmacist or care team to get one. A patient package insert for the product will be given with each prescription and refill. Be sure to read this information carefully each time. The sheet may change often. This medication comes with INSTRUCTIONS FOR USE. Ask your pharmacist for directions on how to use this medication. Read the information carefully. Talk to your care team if you have questions. Talk to your care team about the use of this medication in children. While this medication may be prescribed for children as young as 6 years for selected conditions, precautions do apply. Overdosage: If you think you have taken too  much of this medicine contact a poison control center or emergency room at once. NOTE: This medicine is only for you. Do not share this medicine with others. What if I miss a dose? It is important not to miss any doses. Talk to your care team about what to do if you miss a dose. What may interact with this medication? Interactions are not expected. This list may not describe all possible interactions. Give your health care provider a list of all the medicines, herbs, non-prescription drugs, or dietary supplements you use. Also tell them if you smoke, drink alcohol, or use illegal drugs. Some items may interact with your medicine. What should I watch for while using this medication? Visit your care team for regular checks on your progress. Tell your care team if your symptoms do not start to get better or if they get worse. This medication can decrease the response to a vaccine. If you need to get vaccinated, tell your care team if you have received this medication. Talk to your care team to see if a different vaccination schedule is needed. If you take this medication for asthma, you and your care team should develop an Asthma Action Plan that is just for you. Be sure to know what to do if you are in the yellow (asthma is getting worse) or red (medical alert) zones. This medication is not used to treat sudden breathing problems. What side effects may I notice from receiving this medication? Side effects that you should report to your care team as soon  as possible: Allergic reactions--skin rash, itching, hives, swelling of the face, lips, tongue, or throat Eye pain, redness, irritation, or discharge with blurry or decreased vision Unusual weakness or fatigue, fever, headache, skin rash, muscle or joint pain, loss of appetite, pain, tingling, or numbness in the hands or feet Side effects that usually do not require medical attention (report these to your care team if they continue or are  bothersome): Cold sores Dry eyes Joint pain Pain, redness, or irritation at injection site Sore throat Trouble sleeping This list may not describe all possible side effects. Call your doctor for medical advice about side effects. You may report side effects to FDA at 1-800-FDA-1088. Where should I keep my medication? Keep out of the reach of children and pets. Store in the refrigerator at 2 to 8 degrees C (36 to 46 degrees F). Do not freeze. Do not shake. Keep this medication in the original packaging until you are ready to take it. Protect from light. Get rid of any unused medication after the expiration date. This medication may be stored at room temperature up to 25 degrees C (77 degrees F) for up to 14 days. Keep this medication in the original packaging. Protect from light. If it is stored at room temperature, get rid of any unused medication after 14 days or after it expires, whichever is first. To get rid of medications that are no longer needed or have expired: Take the medication to a medication take-back program. Check with your pharmacy or law enforcement to find a location. If you cannot return the medication, ask your pharmacist or care team how to get rid of this medication safely. NOTE: This sheet is a summary. It may not cover all possible information. If you have questions about this medicine, talk to your doctor, pharmacist, or health care provider.  2023 Elsevier/Gold Standard (2021-04-10 00:00:00)  Bartholin's Cyst  A Bartholin's cyst is a fluid-filled sac that forms as a result of a blockage along the tube (duct) of the Bartholin's gland. Bartholin's glands are small glands in the folds of skin around the vaginal opening (labia). These glands produce fluid to moisten or lubricate the outside of the vagina during sex. A cyst that is not large or infected may not cause any problems or require treatment. If the cyst gets infected with bacteria, it is called a Bartholin's  abscess. An abscess may cause symptoms such as pain and swelling and is more likely to require treatment. What are the causes? This condition may be caused by a blocked Bartholin's gland duct. These ducts can become blocked due to natural buildup of fluid and oils. Bacteria inside of the cyst can cause infection. In many cases, the cause is not known. What are the signs or symptoms? Symptoms may include: A bulge or lump on the labia, near the lower opening of the vagina. Discomfort or pain. This may get worse during sex or when walking. Redness, swelling, or fluid draining from the area. These may be signs of an abscess. How severe your symptoms are depends on the size of your cyst and whether it is infected. Infection causes symptoms to get more severe. How is this diagnosed? This condition may be diagnosed based on: Your symptoms and medical history. A physical exam to check for swelling in your vaginal area. You may lie on your back on an exam table and have your feet placed into footrests for the exam. Blood tests to check for infections. Removal of a fluid sample  from the cyst or abscess (biopsy) for testing. You may work with a health care provider who specializes in [employer] health (gynecologist) for diagnosis and treatment. How is this treated? If your cyst is small, not infected, and not causing symptoms, you may not need treatment. These cysts often go away on their own, with home care such as hot baths or warm compresses. If you have a large cyst or an abscess, treatment may include: Antibiotic medicine. A procedure to drain the fluid inside the cyst or abscess. These procedures involve making an incision in the cyst or abscess so that the fluid drains out, and then one of the following may be done: A small, thin tube (catheter) may be placed inside the cyst or abscess so that it does not close and fill up with fluid again (fistulization). The catheter will be removed at a follow-up  visit. The edges of the incision may be stitched to your skin so that the cyst or abscess stays open (marsupialization). This allows it to continue to drain and not fill up with fluid again. If you have cysts or abscesses that keep returning (recurring) and have required incision and drainage multiple times, your health care provider may talk with you about surgery to remove the Bartholin's gland. Follow these instructions at home: Medicines Take over-the-counter and prescription medicines only as told by your health care provider. If you were prescribed an antibiotic medicine, take it as told by your health care provider. Do not stop taking the antibiotic even if your condition improves. Managing pain and swelling Try sitz baths to help with pain and swelling. A sitz bath is a warm water bath in which the water only comes up to your hips and should cover your buttocks. You may take sitz baths several times a day. Apply heat to the affected area as often as needed. Use the heat source that your health care provider recommends, such as a moist heat pack or a heating pad. Place a towel between your skin and the heat source. Leave the heat on for 20-30 minutes. Remove the heat if your skin turns bright red. This is especially important if you are unable to feel pain, heat, or cold. You may have a greater risk of getting burned. Do not fall asleep with the heating pad in place. General instructions If your cyst or abscess was drained, follow instructions from your health care provider about how to take care of your wound. Use feminine pads as needed to absorb any drainage. Do not push on or squeeze your cyst. Do not have sex until the cyst has gone away or your wound from drainage has healed. Take these steps to help prevent a Bartholin's cyst from returning and to prevent other Bartholin's cysts from developing: Take a bath or shower once a day. Clean your vaginal area with mild soap and water when  you bathe. Practice safe sex to prevent STIs. Talk with your health care provider about how to prevent STIs and which forms of birth control (contraception) may be best for you. Keep all follow-up visits. This is important. Contact a health care provider if: You have a fever. You develop increasing redness, swelling, or pain around your cyst. You have fluid, blood, pus, or a bad smell coming from your cyst. You have a cyst that gets larger or comes back. Summary A Bartholin's cyst is a fluid-filled sac that forms as a result of a blockage along the duct of the Bartholin's gland. If your  cyst is small, not infected, and not causing symptoms, you may not need any treatment. If you have a large cyst or an abscess, your health care provider may perform a procedure to drain the fluid. If you have cysts or abscesses that keep returning (recurring) and have required incision and drainage multiple times, your health care provider may talk with you about surgery to remove the Bartholin's gland. This information is not intended to replace advice given to you by your health care provider. Make sure you discuss any questions you have with your health care provider. Document Revised: 11/13/2019 Document Reviewed: 11/13/2019 Elsevier Patient Education  2023 [employer].

## 2022-01-05 ENCOUNTER — Encounter: Payer: Self-pay | Admitting: Advanced Practice Midwife

## 2022-01-05 ENCOUNTER — Ambulatory Visit (INDEPENDENT_AMBULATORY_CARE_PROVIDER_SITE_OTHER): Payer: No Typology Code available for payment source | Admitting: Advanced Practice Midwife

## 2022-01-05 ENCOUNTER — Other Ambulatory Visit (HOSPITAL_COMMUNITY)
Admission: RE | Admit: 2022-01-05 | Discharge: 2022-01-05 | Disposition: A | Discharge status: Home / Self Care | Payer: No Typology Code available for payment source | Source: Ambulatory Visit | Attending: Advanced Practice Midwife | Admitting: Advanced Practice Midwife

## 2022-01-05 ENCOUNTER — Other Ambulatory Visit (HOSPITAL_COMMUNITY): Payer: Self-pay

## 2022-01-05 VITALS — BP 109/66 | HR 72 | Ht 62.5 in | Wt 157.9 lb

## 2022-01-05 DIAGNOSIS — Z113 Encounter for screening for infections with a predominantly sexual mode of transmission: Secondary | ICD-10-CM

## 2022-01-05 DIAGNOSIS — Z975 Presence of (intrauterine) contraceptive device: Secondary | ICD-10-CM | POA: Insufficient documentation

## 2022-01-05 DIAGNOSIS — Z01419 Encounter for gynecological examination (general) (routine) without abnormal findings: Secondary | ICD-10-CM | POA: Diagnosis present

## 2022-01-05 DIAGNOSIS — L739 Follicular disorder, unspecified: Secondary | ICD-10-CM

## 2022-01-05 DIAGNOSIS — R87810 Cervical high risk human papillomavirus (HPV) DNA test positive: Secondary | ICD-10-CM

## 2022-01-05 DIAGNOSIS — Z30431 Encounter for routine checking of intrauterine contraceptive device: Secondary | ICD-10-CM | POA: Diagnosis not present

## 2022-01-05 DIAGNOSIS — R87610 Atypical squamous cells of undetermined significance on cytologic smear of cervix (ASC-US): Secondary | ICD-10-CM

## 2022-01-05 MED ORDER — CEFADROXIL 500 MG PO CAPS
500.0000 mg | ORAL_CAPSULE | Freq: Two times a day (BID) | ORAL | 0 refills | Status: AC
Start: 2022-01-05 — End: 2022-01-13
  Filled 2022-01-05: qty 14, 7d supply, fill #0

## 2022-01-05 NOTE — Progress Notes (Signed)
   Subjective:     Amanda Blackburn is a 21 y.o. female here at [facility] for a routine exam.  Current complaints: ingrown hair in mons area self drained after Epsom salt baths but is still red and hard and has not resolved.  No problems with IUD placed in 2022.  Pap with ASCUS in 2022.  Personal health questionnaire reviewed: yes.  Do you have a primary care provider? yes Do you feel safe at home? yes  Flowsheet Row Video Visit from 12/31/2021 in [facility]  PHQ-2 Total Score 0       There are no preventive care reminders to display for this patient.   Risk factors for chronic health problems: Smoking: Alchohol/how much: Pt BMI: There is no height or weight on file to calculate BMI.   Gynecologic History No LMP recorded. (Menstrual status: IUD). Contraception: IUD Last Pap: 08/20/20. Results were: ASCUS with positive HPV Last mammogram: n/a.   Obstetric History OB History  Gravida Para Term Preterm AB Living  0 0 0 0 0 0  SAB IAB Ectopic Multiple Live Births  0 0 0 0 0     The following portions of the patient's history were reviewed and updated as appropriate: allergies, current medications, past family history, past medical history, past social history, past surgical history, and problem list.  Review of Systems Pertinent items noted in HPI and remainder of comprehensive ROS otherwise negative.    Objective:   There were no vitals taken for this visit. VS reviewed, nursing note reviewed,  Constitutional: well developed, well nourished, no distress HEENT: normocephalic CV: normal rate Pulm/chest wall: normal effort Breast Exam:  Deferred with low risks and shared decision making, discussed recommendation to start mammogram between 40-50 yo/  Abdomen: soft Neuro: alert and oriented x 3 Skin: warm, dry Psych: affect normal Pelvic exam:Performed: Cervix pink, visually closed, without lesion, IUD string ~ 3 cm in length from cervical os, scant white  creamy discharge, vaginal walls and external genitalia normal Bimanual exam: Cervix 0/long/high, firm, anterior, neg CMT, uterus nontender, nonenlarged, adnexa without tenderness, enlargement, or mass       Assessment/Plan:   1. Well woman exam with routine gynecological exam --No gyn concerns today - Cervicovaginal ancillary only( [place]) - Cytology - PAP( [place])  2. 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 Folliculitis  - cefadroxil (DURICEF) 500 MG capsule; Take 1 capsule (500 mg total) by mouth 2 (two) times daily for 7 days.  Dispense: 14 capsule; Refill: 0  4. IUD check up --IUD string wnl   5. Routine screening for STI (sexually transmitted infection)    No follow-ups on file.   Sharen Counter, CNM 1:50 PM

## 2022-01-05 NOTE — Progress Notes (Signed)
Pt presents for annual exam. Pt requesting STI testing today.  Last PAP:07-2020 ASC-US

## 2022-01-07 LAB — CERVICOVAGINAL ANCILLARY ONLY
Bacterial Vaginitis (gardnerella): NEGATIVE
Candida Glabrata: NEGATIVE
Candida Vaginitis: NEGATIVE
Chlamydia: NEGATIVE
Comment: NEGATIVE
Comment: NEGATIVE
Comment: NEGATIVE
Comment: NEGATIVE
Comment: NEGATIVE
Comment: NORMAL
Neisseria Gonorrhea: NEGATIVE
Trichomonas: NEGATIVE

## 2022-01-08 ENCOUNTER — Other Ambulatory Visit (HOSPITAL_COMMUNITY): Payer: Self-pay

## 2022-01-08 MED ORDER — TRETINOIN 0.05 % EX CREA
TOPICAL_CREAM | CUTANEOUS | 5 refills | Status: DC
  Filled 2022-01-08: qty 45, 30d supply, fill #0

## 2022-01-08 MED ORDER — SPIRONOLACTONE 50 MG PO TABS
50.0000 mg | ORAL_TABLET | Freq: Every day | ORAL | 3 refills | Status: DC
  Filled 2022-01-08: qty 30, 30d supply, fill #0
  Filled 2022-02-09: qty 30, 30d supply, fill #1
  Filled 2022-03-11: qty 30, 30d supply, fill #2
  Filled 2022-04-13: qty 30, 30d supply, fill #3

## 2022-01-09 ENCOUNTER — Other Ambulatory Visit (HOSPITAL_COMMUNITY): Payer: Self-pay

## 2022-01-14 LAB — CYTOLOGY - PAP
Comment: NEGATIVE
Diagnosis: UNDETERMINED — AB
High risk HPV: POSITIVE — AB

## 2022-01-23 ENCOUNTER — Encounter: Payer: Self-pay | Admitting: Internal Medicine

## 2022-01-23 ENCOUNTER — Telehealth (INDEPENDENT_AMBULATORY_CARE_PROVIDER_SITE_OTHER): Payer: No Typology Code available for payment source | Admitting: Internal Medicine

## 2022-01-23 ENCOUNTER — Other Ambulatory Visit (HOSPITAL_COMMUNITY): Payer: Self-pay

## 2022-01-23 VITALS — Ht 62.5 in | Wt 162.0 lb

## 2022-01-23 DIAGNOSIS — N898 Other specified noninflammatory disorders of vagina: Secondary | ICD-10-CM

## 2022-01-23 DIAGNOSIS — H9203 Otalgia, bilateral: Secondary | ICD-10-CM | POA: Diagnosis not present

## 2022-01-23 MED ORDER — DOXYCYCLINE HYCLATE 100 MG PO TABS
100.0000 mg | ORAL_TABLET | Freq: Two times a day (BID) | ORAL | 0 refills | Status: DC
  Filled 2022-01-23: qty 20, 10d supply, fill #0

## 2022-01-23 MED ORDER — NEOMYCIN-POLYMYXIN-HC 3.5-10000-1 OT SOLN
3.0000 [drp] | Freq: Three times a day (TID) | OTIC | 0 refills | Status: DC
  Filled 2022-01-23: qty 10, 7d supply, fill #0

## 2022-01-23 NOTE — Progress Notes (Signed)
Virtual Visit via Video Note  I connected with Amanda Blackburn  on 01/23/22 at  1:00 PM EDT by a video enabled telemedicine application and verified that I am speaking with the correct person using two identifiers.  Location patient: [place] Location provider:work or home office Persons participating in the virtual visit: patient, provider  I discussed the limitations and requested verbal permission for telemedicine visit. The patient expressed understanding and agreed to proceed.   HPI:  Acute telemedicine visit for : Vaginal cyst still painful and present after seen by CNM and on cefadroxil bid x 1 week   Ear pain after excessively clearing ears but she has stopped   Of note established with dermatology Dr. Lanier Prude and enjoyed the visit   Needs letter of exemption for covid 19 shot as had reaction with this in the past and does not want to risk this in the future   -Pertinent past medical history: see below -Pertinent medication allergies: Allergies  Allergen Reactions   Covid-19 (Mrna) Vaccine Diarrhea and Nausea And Vomiting    Fever   Hydroxyzine     In a daze   Prozac [Fluoxetine]     Note effective     Nickel Rash    Rash    Sulfa Antibiotics Rash    Rash    -COVID-19 vaccine status:  Immunization History  Administered Date(s) Administered   DTaP 10/01/1999, 12/02/1999, 01/30/2000, 02/02/2001, 08/03/2003   HPV Quadrivalent 12/03/2011, 02/18/2012, 09/05/2012   Hepatitis A 09/10/2005, 08/04/2006   Hepatitis B 12-Jun-2000, 09/01/1999, 05/18/2000   Hepb-cpg 09/20/2018, 10/25/2018   HiB (PRP-T) 10/01/1999, 12/02/1999, 01/30/2000, 08/02/2000   IPV 10/01/1999, 12/02/1999, 01/30/2000, 08/03/2003   Influenza,inj,Quad PF,6+ Mos 03/20/2015, 08/07/2016, 04/16/2017, 06/20/2018   Influenza-Unspecified 03/30/2019   MMR 08/02/2000, 08/03/2003   Meningococcal Conjugate 08/18/2010, 08/23/2015, 01/14/2018   PFIZER(Purple Top)SARS-COV-2 Vaccination 10/05/2019    Pneumococcal-Unspecified 10/01/1999, 12/02/1999, 01/30/2000, 08/07/2002   Tdap 08/18/2010, 11/18/2021   Varicella 08/02/2000, 09/10/2005     ROS: See pertinent positives and negatives per HPI.  Past Medical History:  Diagnosis Date   Allergy    Anxiety    Asthma    Depression    sees therapy    Eczema    Frequent headaches    Seasonal allergies     Past Surgical History:  Procedure Laterality Date   NO PAST SURGERIES       Current Outpatient Medications:    albuterol (PROAIR HFA) 108 (90 Base) MCG/ACT inhaler, Inhale 2 puffs into the lungs every 4 (four) hours as needed for wheezing or shortness of breath., Disp: 18 g, Rfl: 11   beclomethasone (QVAR REDIHALER) 40 MCG/ACT inhaler, Inhale 2 puffs into the lungs 2 (two) times daily. Rinse mouth after use., Disp: 10.6 g, Rfl: 11   buPROPion (WELLBUTRIN XL) 150 MG 24 hr tablet, Take 1 tablet by mouth daily., Disp: 90 tablet, Rfl: 3   desonide (DESOWEN) 0.05 % cream, Apply topically 2 (two) times daily. To eyelids and under eyes as needed, Disp: 60 g, Rfl: 11   doxycycline (VIBRA-TABS) 100 MG tablet, Take 1 tablet (100 mg total) by mouth 2 (two) times daily. 7-10 days with food, Disp: 20 tablet, Rfl: 0   hydrocortisone (ANUSOL-HC) 2.5 % rectal cream, Place 1 application rectally 2 (two) times daily., Disp: 30 g, Rfl: 11   levocetirizine (XYZAL) 5 MG tablet, TAKE 1 TABLET BY MOUTH EVERY EVENING., Disp: 90 tablet, Rfl: 3   Lidocaine, Anorectal, 5 % CREA, Apply topically 2 times daily as  needed., Disp: 30 g, Rfl: 11   montelukast (SINGULAIR) 10 MG tablet, TAKE 1 TABLET BY MOUTH AT BEDTIME., Disp: 90 tablet, Rfl: 3   mupirocin ointment (BACTROBAN) 2 %, Apply 1 application topically 2 (two) times daily., Disp: 22 g, Rfl: 0   neomycin-polymyxin-hydrocortisone (CORTISPORIN) OTIC solution, Place 3 drops into both ears 3 (three) times daily. X4-7 days, Disp: 10 mL, Rfl: 0   predniSONE (DELTASONE) 20 MG tablet, Take 2 tablets (40 mg total) by  mouth daily., Disp: 10 tablet, Rfl: 0   promethazine-dextromethorphan (PROMETHAZINE-DM) 6.25-15 MG/5ML syrup, Take 5 mLs by mouth 4 (four) times daily as needed for cough., Disp: 118 mL, Rfl: 0   spironolactone (ALDACTONE) 50 MG tablet, Take 1 tablet by mouth once a day, Disp: 30 tablet, Rfl: 3   tretinoin (RETIN-A) 0.05 % cream, Apply a pea sized amount to face and neck at bedtime., Disp: 45 g, Rfl: 5   triamcinolone cream (KENALOG) 0.1 %, Apply 1 application topically 2 (two) times daily., Disp: 30 g, Rfl: 0   fluticasone (FLONASE) 50 MCG/ACT nasal spray, PLACE 2 SPRAYS INTO BOTH NOSTRILS DAILY., Disp: 16 g, Rfl: 11  EXAM:  VITALS per patient if applicable:  GENERAL: alert, oriented, appears well and in no acute distress  HEENT: atraumatic, conjunttiva clear, no obvious abnormalities on inspection of external nose and ears  NECK: normal movements of the head and neck  LUNGS: on inspection no signs of respiratory distress, breathing rate appears normal, no obvious gross SOB, gasping or wheezing  CV: no obvious cyanosis  MS: moves all visible extremities without noticeable abnormality  PSYCH/NEURO: pleasant and cooperative, no obvious depression or anxiety, speech and thought processing grossly intact  ASSESSMENT AND PLAN:  Discussed the following assessment and plan:  Vaginal cyst - Plan: doxycycline (VIBRA-TABS) 100 MG tablet, Ambulatory referral to Obstetrics / Gynecology  Otalgia of both ears - Plan: neomycin-polymyxin-hydrocortisone (CORTISPORIN) OTIC solution   HM HM STD check pt wants to do yearly Flu shot declines  Covid 1 declines had reaction to the 1st Tdap 11/18/21 HPV vx had  Hep A/B vx had but updated 09/20/2018 and 09/2018 with new Hep B vaccine  -immune 03/30/2019 hep B titer had 2/2 new hep B vaccine immune  Meningococcal vx had 01/14/18 MMR immune     Ascus pap 08/20/20 +HPV referred ob/gyn in [place]  Disc safe sex practices has IUD rec  responsible choices and healthy diet and exercise  No etoh or tobacco use  Rec healthy and exercise  Derm Dr. Lanier Prude   -we discussed possible serious and likely etiologies, options for evaluation and workup, limitations of telemedicine visit vs in person visit, treatment, treatment risks and precautions. Pt is agreeable to treatment via telemedicine at this moment.   I discussed the assessment and treatment plan with the patient. The patient was provided an opportunity to ask questions and all were answered. The patient agreed with the plan and demonstrated an understanding of the instructions.    Time spent 20 min Delorise Jackson, MD

## 2022-01-23 NOTE — Telephone Encounter (Signed)
Patient called about her mychart message.  She is asking if she could have the letter by 10 or 11am.

## 2022-01-27 ENCOUNTER — Ambulatory Visit: Payer: No Typology Code available for payment source | Admitting: Internal Medicine

## 2022-02-04 ENCOUNTER — Ambulatory Visit (INDEPENDENT_AMBULATORY_CARE_PROVIDER_SITE_OTHER): Payer: No Typology Code available for payment source | Admitting: Internal Medicine

## 2022-02-04 ENCOUNTER — Other Ambulatory Visit (HOSPITAL_COMMUNITY): Payer: Self-pay

## 2022-02-04 ENCOUNTER — Encounter: Payer: Self-pay | Admitting: Internal Medicine

## 2022-02-04 VITALS — BP 126/74 | HR 86 | Temp 98.2°F | Resp 18 | Ht 63.5 in | Wt 156.7 lb

## 2022-02-04 DIAGNOSIS — L308 Other specified dermatitis: Secondary | ICD-10-CM

## 2022-02-04 DIAGNOSIS — J4520 Mild intermittent asthma, uncomplicated: Secondary | ICD-10-CM | POA: Diagnosis not present

## 2022-02-04 DIAGNOSIS — J3089 Other allergic rhinitis: Secondary | ICD-10-CM

## 2022-02-04 DIAGNOSIS — H1013 Acute atopic conjunctivitis, bilateral: Secondary | ICD-10-CM | POA: Diagnosis not present

## 2022-02-04 DIAGNOSIS — H1045 Other chronic allergic conjunctivitis: Secondary | ICD-10-CM

## 2022-02-04 DIAGNOSIS — T781XXD Other adverse food reactions, not elsewhere classified, subsequent encounter: Secondary | ICD-10-CM

## 2022-02-04 MED ORDER — PIMECROLIMUS 1 % EX CREA
TOPICAL_CREAM | Freq: Two times a day (BID) | CUTANEOUS | 0 refills | Status: DC
  Filled 2022-02-04: qty 30, 15d supply, fill #0

## 2022-02-04 NOTE — Progress Notes (Signed)
Follow Up Note  RE: Amanda Blackburn MRN: [id number] DOB: Jan 08, 2001 Date of Office Visit: 02/04/2022  Referring provider: McLean-Scocuzza, French Ana * Primary care provider: McLean-Scocuzza, Pasty Spillers, MD  Chief Complaint: Eczema (New patient for eczema worst in face wants to discuss injection options- dupixen)  History of Present Illness: I had the pleasure of seeing Amanda Blackburn for a follow up visit at the [facility] on 02/04/2022. She is a 21 y.o. female, who is being followed for intermittent asthma, allergic rhinitis previously on allergy injections, pollen food syndrome. Her previous allergy office visit was on 08/06/2020 with Dr. Dellis Anes. Today is a regular follow up visit.  And new complaint of dermatitis  History obtained from patient, chart review.  Dermatitis:  Diagnosed at age a few weeks ago, flares mostly on her face and manifests as hyperpigmentation as well as flesh-colored papules.  She has seen a dermatologist who diagnosed her with acne and started her on tretinoin and spironolactone without good response..    ASTHMA - Medical therapy: Singulair 10 mg daily - Rescue inhaler use: Will need albuterol if she has outdoor exposure current use approximately 3 times a week - Symptoms: Cough, chest tightness - Exacerbation history:  0 OCS, 0ED, 0 UC visits in the past year  - ACT: 20 /25 - Adverse effects of medication: denies  - Previous FEV1: 2.76 L, 98% - Biologic Labs not indicated   Allergic Rhinitis She previously was on allergy immunotherapy but stopped in 11/22 due to logistics and not being able to make it over to the [place] office for injections.  She is planning to start her masters will have a more stable schedule and wants to restart.  She does feel like her symptoms are better controlled when she was on AIT.  She does not feel like she has time to do rush for buildup but does want to do standard buildup.  She is maintained some controlled  with medical therapy with Zyrtec and Singulair.  She does report increased rhinorrhea over the past 24 hours.  She has a history of oral allergy syndrome which she manages by avoiding fresh fruits and vegetables.   Assessment and Plan: Amanda Blackburn is a 21 y.o. female with: Other specified dermatitis  Mild intermittent asthma without complication - Plan: Spirometry with Graph  Pollen-food allergy, subsequent encounter  Other allergic rhinitis  Other chronic allergic conjunctivitis of both eyes Plan: Patient Instructions  1. Dermatitis  - continue medications as prescribed from Dermatology -Start Elidel cream in the morning   2. Mild intermittent asthma  - Lung testing looked good.   - I think we can just monitor for now, and focus on rhinitis plan.  Monitor your albuterol use and if using more than 3-4 times per week we should start a controller therapy.  - Continue with montelukast 10mg  daily. - Continue with albuterol 4 puffs every 4-6 hours as needed.   3. Seasonal and perennial allergic rhinitis (grasses, weeds, trees, ragweed, mold, dust mite, cat)  - Start Flonase 1 spray per nostril daily  - Continue with Xyzal 5mg  daily. - Continue with Singulair 10mg  daily.  - We will restart your allergy injections, recommend scheduling a AIT first injection appointment sometime after 9/11 given the pending move to the new building.   4. Pollen-food allergy - Continue to avoid all triggering foods. - Let know if you need a new epipen   Follow up with me in [place] (Im there on Fridays)  in 8 weeks  Good luck with your Masters!  I look forward to hearing how its going !   Thank you so much for letting me partake in your care today.  Don't hesitate to reach out if you have any additional concerns!  Roney Marion, MD  [facility] [place] [place]   Please inform us of any Emergency Department visits, hospitalizations, or changes in symptoms. Call us before going to  the ED for breathing or allergy symptoms since we might be able to fit you in for a sick visit. Feel free to contact us anytime with any questions, problems, or concerns.  It was a pleasure to meet you today!  Websites that have reliable patient information: 1. American Academy of Asthma, Allergy, and Immunology: www.aaaai.org 2. Food Allergy Research and Education (FARE): foodallergy.org 3. Mothers of Asthmatics: http://www.asthmacommunitynetwork.org 4. American College of Allergy, Asthma, and Immunology: www.acaai.org     No follow-ups on file.  Meds ordered this encounter  Medications   pimecrolimus (ELIDEL) 1 % cream    Sig: Apply topically 2 (two) times daily.    Dispense:  30 g    Refill:  0    Lab Orders  No laboratory test(s) ordered today   Diagnostics: Spirometry:  Tracings reviewed. Her effort: Good reproducible efforts. FVC: 3.26 L FEV1: 2.33 L, 81% predicted FEV1/FVC ratio: 71% Interpretation: Spirometry consistent with normal pattern.  Please see scanned spirometry results for details.   Results interpreted by myself during this encounter and discussed with patient/family.   Medication List:  Current Outpatient Medications  Medication Sig Dispense Refill   albuterol (PROAIR HFA) 108 (90 Base) MCG/ACT inhaler Inhale 2 puffs into the lungs every 4 (four) hours as needed for wheezing or shortness of breath. 18 g 11   beclomethasone (QVAR REDIHALER) 40 MCG/ACT inhaler Inhale 2 puffs into the lungs 2 (two) times daily. Rinse mouth after use. 10.6 g 11   buPROPion (WELLBUTRIN XL) 150 MG 24 hr tablet Take 1 tablet by mouth daily. 90 tablet 3   desonide (DESOWEN) 0.05 % cream Apply topically 2 (two) times daily. To eyelids and under eyes as needed 60 g 11   doxycycline (VIBRA-TABS) 100 MG tablet Take 1 tablet by mouth 2  times daily. 7-10 days with food 20 tablet 0   hydrocortisone (ANUSOL-HC) 2.5 % rectal cream Place 1 application rectally 2 (two) times daily. 30  g 11   levocetirizine (XYZAL) 5 MG tablet TAKE 1 TABLET BY MOUTH EVERY EVENING. 90 tablet 3   montelukast (SINGULAIR) 10 MG tablet TAKE 1 TABLET BY MOUTH AT BEDTIME. 90 tablet 3   mupirocin ointment (BACTROBAN) 2 % Apply 1 application topically 2 (two) times daily. 22 g 0   neomycin-polymyxin-hydrocortisone (CORTISPORIN) OTIC solution Place 3 drops into both ears 3  times daily for 4-7 days 10 mL 0   pimecrolimus (ELIDEL) 1 % cream Apply topically 2 (two) times daily. 30 g 0   spironolactone (ALDACTONE) 50 MG tablet Take 1 tablet by mouth once a day 30 tablet 3   tretinoin (RETIN-A) 0.05 % cream Apply a pea sized amount to face and neck at bedtime. 45 g 5   triamcinolone cream (KENALOG) 0.1 % Apply 1 application topically 2 (two) times daily. 30 g 0   fluticasone (FLONASE) 50 MCG/ACT nasal spray PLACE 2 SPRAYS INTO BOTH NOSTRILS DAILY. 16 g 11   Lidocaine, Anorectal, 5 % CREA Apply topically 2 times daily as needed. (Patient not taking: Reported  on 02/04/2022) 30 g 11   predniSONE (DELTASONE) 20 MG tablet Take 2 tablets (40 mg total) by mouth daily. (Patient not taking: Reported on 02/04/2022) 10 tablet 0   promethazine-dextromethorphan (PROMETHAZINE-DM) 6.25-15 MG/5ML syrup Take 5 mLs by mouth 4 (four) times daily as needed for cough. (Patient not taking: Reported on 02/04/2022) 118 mL 0   No current facility-administered medications for this visit.   Allergies: Allergies  Allergen Reactions   Covid-19 (Mrna) Vaccine Diarrhea and Nausea And Vomiting    Fever   Hydroxyzine     In a daze   Prozac [Fluoxetine]     Note effective     Nickel Rash    Rash    Sulfa Antibiotics Rash    Rash    I reviewed her past medical history, social history, family history, and environmental history and no significant changes have been reported from her previous visit.  ROS: All others negative except as noted per HPI.   Objective: BP 126/74 (BP Location: Left Arm, Patient Position: Sitting, Cuff Size:  Normal)   Pulse 86   Temp 98.2 F (36.8 C) (Temporal)   Resp 18   Ht 5' 3.5" (1.613 m)   Wt 156 lb 11.2 oz (71.1 kg)   LMP 12/27/2021   SpO2 99%   BMI 27.32 kg/m  Body mass index is 27.32 kg/m. General Appearance:  Alert, cooperative, no distress, appears stated age  Head:  Normocephalic, without obvious abnormality, atraumatic  Eyes:  Conjunctiva clear, EOM's intact  Nose: Nares normal,  pale boggy edematous nasal mucosa with clear rhinorrhea, hypertrophic turbinates, no visible anterior polyps, and septum midline  Throat: Lips, tongue normal; teeth and gums normal, tonsils 3+ and + cobblestoning  Neck: Supple, symmetrical  Lungs:   clear to auscultation bilaterally, Respirations unlabored, no coughing  Heart:  regular rate and rhythm and no murmur, Appears well perfused  Extremities: No edema  Skin: Skin color, texture, turgor normal, no rashes or lesions on visualized portions of skin  flesh-colored papules around mouth and nose, some areas of hyperpigmentation  Neurologic: No gross deficits   Previous notes and tests were reviewed. The plan was reviewed with the patient/family, and all questions/concerned were addressed.  It was my pleasure to see Amanda Blackburn today and participate in her care. Please feel free to contact me with any questions or concerns.  Sincerely,  Ferol Luz, MD  Allergy & Immunology  [facility] Office: [phone]

## 2022-02-04 NOTE — Patient Instructions (Addendum)
1. Dermatitis  - continue medications as prescribed from Dermatology -Start Elidel cream in the morning   2. Mild intermittent asthma  - Lung testing looked good.   - I think we can just monitor for now, and focus on rhinitis plan.  Monitor your albuterol use and if using more than 3-4 times per week we should start a controller therapy.  - Continue with montelukast 10mg  daily. - Continue with albuterol 4 puffs every 4-6 hours as needed.   3. Seasonal and perennial allergic rhinitis (grasses, weeds, trees, ragweed, mold, dust mite, cat)  - Start Flonase 1 spray per nostril daily  - Continue with Xyzal 5mg  daily. - Continue with Singulair 10mg  daily.  - We will restart your allergy injections, recommend scheduling a AIT first injection appointment sometime after 9/11 given the pending move to the new building.   4. Pollen-food allergy - Continue to avoid all triggering foods. - Let know if you need a new epipen   Follow up with me in [place] (Im there on Fridays) in 8 weeks  Good luck with your Masters!  I look forward to hearing how its going !   Thank you so much for letting me partake in your care today.  Don't hesitate to reach out if you have any additional concerns!  [name], MD  [facility] [place] [place]   Please inform [place] of any Emergency Department visits, hospitalizations, or changes in symptoms. Call [phone] before going to the ED for breathing or allergy symptoms since we might be able to fit you in for a sick visit. Feel free to contact Ferol Luz anytime with any questions, problems, or concerns.  It was a pleasure to meet you today!  Websites that have reliable patient information: 1. American Academy of Asthma, Allergy, and Immunology: www.aaaai.org 2. Food Allergy Research and Education (FARE): foodallergy.org 3. Mothers of Asthmatics: http://www.asthmacommunitynetwork.org 4. American College of Allergy, Asthma, and Immunology:  www.acaai.org

## 2022-02-05 ENCOUNTER — Other Ambulatory Visit (HOSPITAL_COMMUNITY): Payer: Self-pay

## 2022-02-09 ENCOUNTER — Other Ambulatory Visit (HOSPITAL_COMMUNITY): Payer: Self-pay

## 2022-02-11 DIAGNOSIS — J3081 Allergic rhinitis due to animal (cat) (dog) hair and dander: Secondary | ICD-10-CM | POA: Diagnosis not present

## 2022-02-11 NOTE — Progress Notes (Signed)
VIALS EXP 02-12-23 

## 2022-02-12 DIAGNOSIS — J3089 Other allergic rhinitis: Secondary | ICD-10-CM | POA: Diagnosis not present

## 2022-02-16 ENCOUNTER — Ambulatory Visit (INDEPENDENT_AMBULATORY_CARE_PROVIDER_SITE_OTHER): Payer: No Typology Code available for payment source | Admitting: Obstetrics and Gynecology

## 2022-02-16 ENCOUNTER — Encounter: Payer: Self-pay | Admitting: Obstetrics and Gynecology

## 2022-02-16 VITALS — BP 110/73 | HR 80 | Wt 157.9 lb

## 2022-02-16 DIAGNOSIS — Z09 Encounter for follow-up examination after completed treatment for conditions other than malignant neoplasm: Secondary | ICD-10-CM

## 2022-02-16 DIAGNOSIS — L738 Other specified follicular disorders: Secondary | ICD-10-CM | POA: Diagnosis not present

## 2022-02-16 NOTE — Progress Notes (Signed)
21 yo here for follow up on folliculitis. Patient reports feeling well. She reports complete resolution without drainage following antibiotic course and sitz bath.   Past Medical History:  Diagnosis Date   Allergy    Anxiety    Asthma    Depression    sees therapy    Eczema    Frequent headaches    Seasonal allergies    Past Surgical History:  Procedure Laterality Date   NO PAST SURGERIES     Family History  Problem Relation Age of Onset   Asthma Mother    Diabetes Mother    Hypertension Father    Asthma Sister    Social History   Tobacco Use   Smoking status: Never   Smokeless tobacco: Never  Vaping Use   Vaping Use: Never used  Substance Use Topics   Alcohol use: No   Drug use: No   ROS See pertinent in HPI. All other systems reviewed and non contributory Blood pressure 110/73, pulse 80, weight 157 lb 14.4 oz (71.6 kg). GENERAL: Well-developed, well-nourished female in no acute distress.  ABDOMEN: Soft, nontender, nondistended. No organomegaly. PELVIC: Normal external female genitalia. Chaperone present during the pelvic exam EXTREMITIES: No cyanosis, clubbing, or edema, 2+ distal pulses.  A/P 21 yo with normal exam - reassurance provided - Also discussed the use of a good moisturizer post shaving to prevent skin irritation and folliculitis - Patient current on pap smear and is due for repeat next year

## 2022-02-16 NOTE — Progress Notes (Signed)
Pt is in the office reporting that she previously had 2 vaginal cysts. Pt states that last week she started using antibacterial soap and doing apple cider vinegar baths and the cysts are now gone.

## 2022-03-06 ENCOUNTER — Ambulatory Visit (INDEPENDENT_AMBULATORY_CARE_PROVIDER_SITE_OTHER): Payer: No Typology Code available for payment source | Admitting: Internal Medicine

## 2022-03-06 ENCOUNTER — Other Ambulatory Visit (HOSPITAL_COMMUNITY): Payer: Self-pay

## 2022-03-06 ENCOUNTER — Encounter: Payer: Self-pay | Admitting: Internal Medicine

## 2022-03-06 VITALS — BP 118/60 | HR 77 | Temp 98.6°F | Ht 63.5 in | Wt 157.6 lb

## 2022-03-06 DIAGNOSIS — J4520 Mild intermittent asthma, uncomplicated: Secondary | ICD-10-CM | POA: Diagnosis not present

## 2022-03-06 DIAGNOSIS — E559 Vitamin D deficiency, unspecified: Secondary | ICD-10-CM | POA: Diagnosis not present

## 2022-03-06 MED ORDER — CHOLECALCIFEROL 1.25 MG (50000 UT) PO CAPS
50000.0000 [IU] | ORAL_CAPSULE | ORAL | 1 refills | Status: DC
  Filled 2022-03-06: qty 13, 90d supply, fill #0

## 2022-03-06 MED ORDER — ALBUTEROL SULFATE HFA 108 (90 BASE) MCG/ACT IN AERS
2.0000 | INHALATION_SPRAY | RESPIRATORY_TRACT | 11 refills | Status: DC | PRN
  Filled 2022-03-06: qty 6.7, 17d supply, fill #0

## 2022-03-06 MED ORDER — QVAR REDIHALER 40 MCG/ACT IN AERB
2.0000 | INHALATION_SPRAY | Freq: Two times a day (BID) | RESPIRATORY_TRACT | 11 refills | Status: AC
  Filled 2022-03-06: qty 10.6, 30d supply, fill #0

## 2022-03-06 MED ORDER — LEVOCETIRIZINE DIHYDROCHLORIDE 5 MG PO TABS
ORAL_TABLET | Freq: Every evening | ORAL | 3 refills | Status: DC
  Filled 2022-03-06: qty 90, 90d supply, fill #0

## 2022-03-06 MED ORDER — MONTELUKAST SODIUM 10 MG PO TABS
ORAL_TABLET | Freq: Every day | ORAL | 3 refills | Status: DC
  Filled 2022-03-06: qty 90, 90d supply, fill #0

## 2022-03-06 NOTE — Progress Notes (Signed)
Chief Complaint  Patient presents with   Follow-up    Pt would like to discuss the removal of BC and have labs done pt is fasting    F/u  1. Vit d def will send Rx high dose weekly reviewed b/w 10/2021 11/18/21 16:21 VITD: 20.13 (L)   (L): Data is abnormally low 2. Acne better seeing Dr. Lanier Prude  3. Needs refills of asthma meds    Review of Systems  Constitutional:  Negative for weight loss.  HENT:  Negative for hearing loss.   Eyes:  Negative for blurred vision.  Respiratory:  Negative for shortness of breath.   Cardiovascular:  Negative for chest pain.  Gastrointestinal:  Negative for abdominal pain and blood in stool.  Genitourinary:  Negative for dysuria.  Musculoskeletal:  Negative for falls and joint pain.  Skin:  Negative for rash.  Neurological:  Negative for headaches.  Psychiatric/Behavioral:  Negative for depression.    Past Medical History:  Diagnosis Date   Allergy    Anxiety    Asthma    Depression    sees therapy    Eczema    Frequent headaches    Seasonal allergies    Past Surgical History:  Procedure Laterality Date   NO PAST SURGERIES     Family History  Problem Relation Age of Onset   Asthma Mother    Diabetes Mother    Hypertension Father    Asthma Sister    Social History   Socioeconomic History   Marital status: Single    Spouse name: Not on file   Number of children: Not on file   Years of education: Not on file   Highest education level: Not on file  Occupational History   Not on file  Tobacco Use   Smoking status: Never   Smokeless tobacco: Never  Vaping Use   Vaping Use: Never used  Substance and Sexual Activity   Alcohol use: No   Drug use: No   Sexual activity: Yes    Birth control/protection: I.U.D.  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   Cholecalciferol 1.25 MG (50000 UT) capsule Take 1 capsule (50,000 Units total) by mouth once a week. d3   desonide (DESOWEN) 0.05 % cream Apply topically 2 (two) times daily. To eyelids and under eyes as needed   doxycycline (VIBRA-TABS) 100 MG tablet Take 1 tablet by mouth 2  times daily. 7-10 days with food   hydrocortisone (ANUSOL-HC) 2.5 % rectal cream Place 1 application rectally 2 (two) times daily.   mupirocin ointment (BACTROBAN) 2 % Apply 1 application topically 2 (two) times daily.   pimecrolimus (ELIDEL) 1 % cream Apply  topically 2 (two) times daily.   spironolactone (ALDACTONE) 50 MG tablet Take 1 tablet by mouth once a day   tretinoin (RETIN-A) 0.05 % cream Apply a pea sized amount to face and neck at bedtime.   triamcinolone cream (KENALOG) 0.1 % Apply 1 application topically 2 (two) times daily.   [DISCONTINUED] albuterol (PROAIR HFA) 108 (90 Base) MCG/ACT inhaler Inhale 2 puffs into the lungs every 4 (four) hours as needed for wheezing or shortness of breath.   [DISCONTINUED] beclomethasone (QVAR REDIHALER) 40 MCG/ACT inhaler Inhale 2 puffs into the lungs 2 (two) times daily. Rinse mouth after use.   [DISCONTINUED] levocetirizine (XYZAL) 5 MG tablet TAKE 1 TABLET BY MOUTH EVERY EVENING.   [DISCONTINUED] montelukast (SINGULAIR) 10 MG tablet TAKE 1 TABLET BY MOUTH AT BEDTIME.   [DISCONTINUED] neomycin-polymyxin-hydrocortisone (CORTISPORIN) OTIC solution Place 3 drops into both ears 3  times daily for 4-7 days    [DISCONTINUED] predniSONE (DELTASONE) 20 MG tablet Take 2 tablets (40 mg total) by mouth daily.   [DISCONTINUED] promethazine-dextromethorphan (PROMETHAZINE-DM) 6.25-15 MG/5ML syrup Take 5 mLs by mouth 4 (four) times daily as needed for cough.   Allergies  Allergen Reactions   Covid-19 (Mrna) Vaccine Diarrhea and Nausea And Vomiting    Fever   Hydroxyzine     In a daze   Prozac [Fluoxetine]     Note effective     Nickel Rash    Rash    Sulfa Antibiotics Rash    Rash    Recent Results (from the past 2160 hour(s))  Cervicovaginal ancillary only( [place])     Status: None   Collection Time: 01/05/22  5:10 PM  Result Value Ref Range   Neisseria Gonorrhea Negative    Chlamydia Negative    Trichomonas Negative    Bacterial Vaginitis (gardnerella) Negative    Candida Vaginitis Negative    Candida Glabrata Negative    Comment      Normal Reference Range Bacterial Vaginosis - Negative   Comment Normal Reference Range Candida Species - Negative    Comment Normal Reference Range Candida Galbrata - Negative    Comment Normal Reference Range Trichomonas - Negative    Comment Normal Reference Ranger Chlamydia - Negative    Comment      Normal Reference Range Neisseria Gonorrhea - Negative  Cytology - PAP( [place])     Status: Abnormal   Collection Time: 01/05/22  5:10 PM  Result Value Ref Range   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   Diagnosis (A)     - Atypical squamous cells of undetermined significance (ASC-US)   Comment Normal Reference Range HPV - Negative    Objective  Body mass index is 27.48 kg/m. Wt Readings from Last 3 Encounters:  03/06/22 157 lb 9.6 oz (71.5 kg)  02/16/22 157 lb 14.4 oz (71.6 kg)  02/04/22 156 lb 11.2 oz (71.1 kg)   Temp Readings from Last 3 Encounters:  03/06/22 98.6 F (37 C) (Oral)  02/04/22 98.2 F (36.8 C) (Temporal)  11/18/21 97.7 F (36.5 C) (Oral)    BP Readings from Last 3 Encounters:  03/06/22 118/60  02/16/22 110/73  02/04/22 126/74   Pulse Readings from Last 3 Encounters:  03/06/22 77  02/16/22 80  02/04/22 86    Physical Exam Vitals and nursing note reviewed.  Constitutional:      Appearance: Normal appearance. She is well-developed and well-groomed.  HENT:     Head: Normocephalic and atraumatic.  Eyes:     Conjunctiva/sclera: Conjunctivae normal.     Pupils: Pupils are equal, round, and reactive to light.  Cardiovascular:     Rate and Rhythm: Normal rate and regular rhythm.     Heart sounds: Normal heart sounds. No murmur heard. Pulmonary:     Effort: Pulmonary effort is normal.     Breath sounds: Normal breath sounds.  Abdominal:     General: Abdomen is flat. Bowel sounds are normal.     Tenderness: There is no abdominal tenderness.  Musculoskeletal:        General: No tenderness.  Skin:    General: Skin is warm and dry.  Neurological:     General: No focal deficit present.     Mental Status: She is alert and oriented to person, place, and time. Mental status is at baseline.     Cranial Nerves: Cranial nerves 2-12 are intact.     Motor: Motor function is intact.     Coordination: Coordination is intact.     Gait: Gait is intact.  Psychiatric:        Attention and Perception: Attention and perception normal.        Mood and Affect: Mood and affect normal.        Speech: Speech normal.        Behavior: Behavior normal. Behavior is cooperative.        Thought Content: Thought content normal.        Cognition and Memory: Cognition and memory normal.        Judgment: Judgment normal.     Assessment  Plan  Vitamin D deficiency - Plan: Cholecalciferol 1.25 MG (50000 UT) capsule  Mild intermittent asthma without complication - Plan: albuterol (PROAIR HFA) 108 (90 Base) MCG/ACT inhaler, beclomethasone (QVAR REDIHALER) 40 MCG/ACT inhaler  Singulair and xyzal   HM STD check pt wants to do yearly Flu  shot declines  Covid 1 declines  Tdap 11/18/21 HPV vx had  Hep A/B vx had but updated 09/20/2018 and 09/2018 with new Hep B vaccine  -immune 03/30/2019 hep B titer had 2/2 new hep B vaccine immune  Meningococcal vx had 01/14/18 MMR immune     Ascus pap 08/20/20 +HPV referred ob/gyn in [facility] Pap 01/05/22 hpv +ascus  Disc safe sex practices has IUD rec responsible choices and healthy diet and exercise  No etoh or tobacco use  Rec healthy and exercise   Provider: Dr. Olivia Mackie McLean-Scocuzza-Internal Medicine

## 2022-03-06 NOTE — Patient Instructions (Addendum)
Vitamin D3 4000 IU daily   Dr. Cheryll Cockayne  Phone Fax E-mail Address  [phone] Not available [address]   [place] [address]     Specialties     Internal Medicine        Vitamin D Deficiency Vitamin D deficiency is when your body does not have enough vitamin D. Vitamin D is important to your body because: It helps the body maintain calcium and phosphorus levels. These are important minerals. It plays a role in bone health. It reduces inflammation. It improves the body's defense system (immune system). If vitamin D deficiency is severe, it can cause a condition in which your bones become soft. In adults, this condition is called osteomalacia. In children, this condition is called rickets. What are the causes? This condition may be caused by: Not eating enough foods that contain vitamin D. Not getting enough natural sun exposure. Having certain digestive system diseases that make it difficult for your body to absorb vitamin D. These diseases include Crohn's disease, long-term (chronic) pancreatitis, and cystic fibrosis. Having had a surgery in which a part of the stomach or a part of the small intestine was removed. What increases the risk? You are more likely to develop this condition if you: Are an older adult. Do not spend much time outdoors. Live in a long-term care facility. Have dark skin. Take certain medicines, such as steroid medicines or certain seizure medicines. Are overweight or obese. Have chronic kidney or liver disease. What are the signs or symptoms? In mild cases of vitamin D deficiency, there may not be any symptoms. If the condition is severe, symptoms may include: Bone pain. Muscle pain. Not being able to walk normally (abnormal gait). Broken bones caused by a minor injury. Joint pain. How is this diagnosed? This condition may be diagnosed with blood tests. Imaging tests such as X-rays may also be done to look for changes in the  bone. How is this treated? Treatment may include taking supplements as told by your health care provider. Your health care provider will tell you what dose is best for you. Supplements may include: Vitamin D. Calcium. Follow these instructions at home: Eating and drinking Eat foods that contain vitamin D, such as: Dairy products, cereals, or juices that have vitamin D added to them (are fortified). Check the label. Fish, such as salmon or trout. Eggs. The vitamin D is in the yolk. Mushrooms that were treated with UV light. Beef liver. The items listed above may not be a complete list of foods and beverages you can eat and drink. Contact a dietitian for more information. General instructions Take over-the-counter and prescription medicines only as told by your health care provider. Take supplements only as told by your health care provider. Get regular, safe exposure to natural sunlight. Do not use a tanning bed. Maintain a healthy weight. Lose weight if needed. Keep all follow-up visits. This is important. How is this prevented? You can get vitamin D by: Eating foods that naturally contain vitamin D. Eating or drinking products that have been fortified with vitamin D, such as cereals, juices, and dairy products, including milk. Taking a vitamin D supplement or a multivitamin that contains vitamin D. Being in the sun. Your body naturally makes vitamin D when your skin is exposed to sunlight. Your body changes the sunlight into a form of the vitamin that it can use. Contact a health care provider if: Your symptoms do not go away. You feel nauseous  or you vomit. You have fewer bowel movements than usual or are constipated. Summary Vitamin D deficiency is when your body does not have enough vitamin D. Vitamin D helps to keep your bones healthy. Vitamin D deficiency is primarily treated by taking supplements. Your health care provider will suggest what dose is best for you. You can get  vitamin D by eating foods that contain vitamin D, by being in the sun, and by taking a vitamin D supplement or a multivitamin that contains vitamin D. This information is not intended to replace advice given to you by your health care provider. Make sure you discuss any questions you have with your health care provider. Document Revised: 03/21/2021 Document Reviewed: 03/21/2021 Elsevier Patient Education  2023 [employer].

## 2022-03-08 ENCOUNTER — Encounter: Payer: Self-pay | Admitting: Internal Medicine

## 2022-03-08 NOTE — Progress Notes (Signed)
Dr. Cheryll Cockayne agreeable to see patient she can call for appt to establish when physical due and/or w/in the next 6 months    Phone Fax E-mail Address  [phone] Not available [address]   [place] [address]     Specialties     Internal Medicine

## 2022-03-09 NOTE — Telephone Encounter (Signed)
Pt called stating she sent a my chart message about needing a work note for her previous visit. Pl needs the note today

## 2022-03-11 ENCOUNTER — Other Ambulatory Visit (HOSPITAL_COMMUNITY): Payer: Self-pay

## 2022-03-11 ENCOUNTER — Encounter: Payer: Self-pay | Admitting: Internal Medicine

## 2022-03-11 ENCOUNTER — Ambulatory Visit: Payer: No Typology Code available for payment source

## 2022-03-11 ENCOUNTER — Telehealth: Payer: No Typology Code available for payment source | Admitting: Internal Medicine

## 2022-03-11 VITALS — Ht 63.5 in | Wt 157.0 lb

## 2022-03-11 DIAGNOSIS — L304 Erythema intertrigo: Secondary | ICD-10-CM

## 2022-03-11 MED ORDER — HYDROCORTISONE 2.5 % EX OINT
TOPICAL_OINTMENT | Freq: Two times a day (BID) | CUTANEOUS | 2 refills | Status: DC
  Filled 2022-03-11: qty 56.7, 14d supply, fill #0

## 2022-03-11 MED ORDER — FLUCONAZOLE 150 MG PO TABS
150.0000 mg | ORAL_TABLET | ORAL | 0 refills | Status: DC
  Filled 2022-03-11: qty 4, 28d supply, fill #0

## 2022-03-11 MED ORDER — CLOTRIMAZOLE 1 % EX CREA
1.0000 | TOPICAL_CREAM | Freq: Two times a day (BID) | CUTANEOUS | 2 refills | Status: DC
  Filled 2022-03-11: qty 60, 30d supply, fill #0

## 2022-03-11 NOTE — Patient Instructions (Addendum)
Gold bond talc free powder otc   Intertrigo Intertrigo is skin irritation or inflammation (dermatitis) that occurs when folds of skin rub together. The irritation can cause a rash and make skin raw and itchy. This condition most commonly occurs in the skin folds of these areas: Toes. Armpits. Groin. Under the belly. Under the breasts. Buttocks. Intertrigo is not passed from person to person (is not contagious). What are the causes? This condition is caused by heat, moisture, rubbing (friction), and not enough air circulation. The condition can be made worse by: Sweat. Bacteria. A fungus, such as yeast. What increases the risk? This condition is more likely to occur if you have moisture in your skin folds. You are more likely to develop this condition if you: Have diabetes. Are overweight. Are not able to move around or are not active. Live in a warm and moist climate. Wear splints, braces, or other medical devices. Are not able to control your bowels or bladder (have incontinence). What are the signs or symptoms? Symptoms of this condition include: A pink or red skin rash in the skin fold or near the skin fold. Raw or scaly skin. Itchiness. A burning feeling. Bleeding. Leaking fluid. A bad smell. How is this diagnosed? This condition is diagnosed with a medical history and physical exam. You may also have a skin swab to test for bacteria or a fungus. How is this treated? This condition may be treated by: Cleaning and drying your skin. Taking an antibiotic medicine or using an antibiotic skin cream for a bacterial infection. Using an antifungal cream on your skin or taking pills for an infection that was caused by a fungus, such as yeast. Using a steroid ointment to relieve itchiness and irritation. Separating the skin fold with a clean cotton cloth to absorb moisture and allow air to flow into the area. Follow these instructions at home: Keep the affected area clean and  dry. Do not scratch your skin. Stay in a cool environment as much as possible. Use an air conditioner or fan, if available. Apply over-the-counter and prescription medicines only as told by your health care provider. If you were prescribed an antibiotic medicine, use it as told by your health care provider. Do not stop using the antibiotic even if your condition improves. Keep all follow-up visits as told by your health care provider. This is important. How is this prevented?  Maintain a healthy weight. Take care of your feet, especially if you have diabetes. Foot care includes: Wearing shoes that fit well. Keeping your feet dry. Wearing clean, breathable socks. Protect the skin around your groin and buttocks, especially if you have incontinence. Skin protection includes: Following a regular cleaning routine. Using skin protectant creams, powders, or ointments. Changing protection pads frequently. Do not wear tight clothes. Wear clothes that are loose, absorbent, and made of cotton. Wear a bra that gives good support, if needed. Shower and dry yourself well after activity or exercise. Use a hair dryer on a cool setting to dry between skin folds, especially after you bathe. If you have diabetes, keep your blood sugar under control. Contact a health care provider if: Your symptoms do not improve with treatment. Your symptoms get worse or they spread. You notice increased redness and warmth. You have a fever. Summary Intertrigo is skin irritation or inflammation (dermatitis) that occurs when folds of skin rub together. This condition is caused by heat, moisture, rubbing (friction), and not enough air circulation. This condition may be treated by  cleaning and drying your skin and with medicines. Apply over-the-counter and prescription medicines only as told by your health care provider. Keep all follow-up visits as told by your health care provider. This is important. This information is  not intended to replace advice given to you by your health care provider. Make sure you discuss any questions you have with your health care provider. Document Revised: 08/27/2021 Document Reviewed: 03/31/2021 Elsevier Patient Education  [employer].

## 2022-03-11 NOTE — Progress Notes (Signed)
Telephone Note  I connected with Amanda Blackburn   on 03/11/22 at  3:00 PM EDT by telephone and verified that I am speaking with the correct person using two identifiers.  Location patient: [place] Location provider:work or home office Persons participating in the virtual visit: patient, provider  I discussed the limitations and requested verbal permission for telemedicine visit. The patient expressed understanding and agreed to proceed.   HPI:  Acute telemedicine visit for : Mild Itchy rash b/l underneath breasts she forgot to mention as last appt 03/06/22 nothing tried   -Pertinent past medical history: see below -Pertinent medication allergies: Allergies  Allergen Reactions   Covid-19 (Mrna) Vaccine Diarrhea and Nausea And Vomiting    Fever   Hydroxyzine     In a daze   Prozac [Fluoxetine]     Note effective     Nickel Rash    Rash    Sulfa Antibiotics Rash    Rash    -COVID-19 vaccine status:  Immunization History  Administered Date(s) Administered   DTaP 10/01/1999, 12/02/1999, 01/30/2000, 02/02/2001, 08/03/2003   HIB (PRP-T) 10/01/1999, 12/02/1999, 01/30/2000, 08/02/2000   HPV Quadrivalent 12/03/2011, 02/18/2012, 09/05/2012   Hepatitis A 09/10/2005, 08/04/2006   Hepatitis B Apr 10, 2000, 09/01/1999, 05/18/2000   Hepb-cpg 09/20/2018, 10/25/2018   IPV 10/01/1999, 12/02/1999, 01/30/2000, 08/03/2003   Influenza,inj,Quad PF,6+ Mos 03/20/2015, 08/07/2016, 04/16/2017, 06/20/2018   Influenza-Unspecified 03/30/2019   MMR 08/02/2000, 08/03/2003   Meningococcal Conjugate 08/18/2010, 08/23/2015, 01/14/2018   PFIZER(Purple Top)SARS-COV-2 Vaccination 10/05/2019   Pneumococcal-Unspecified 10/01/1999, 12/02/1999, 01/30/2000, 08/07/2002   Tdap 08/18/2010, 11/18/2021   Varicella 08/02/2000, 09/10/2005     ROS: See pertinent positives and negatives per HPI.  Past Medical History:  Diagnosis Date   Allergy    Anxiety    Asthma    Depression    sees therapy    Eczema     Frequent headaches    Seasonal allergies     Past Surgical History:  Procedure Laterality Date   NO PAST SURGERIES       Current Outpatient Medications:    albuterol (PROAIR HFA) 108 (90 Base) MCG/ACT inhaler, Inhale 2 puffs into the lungs every 4 (four) hours as needed for wheezing or shortness of breath., Disp: 6.7 g, Rfl: 11   beclomethasone (QVAR REDIHALER) 40 MCG/ACT inhaler, Inhale 2 puffs into the lungs 2 (two) times daily. Rinse mouth after use., Disp: 10.6 g, Rfl: 11   buPROPion (WELLBUTRIN XL) 150 MG 24 hr tablet, Take 1 tablet by mouth daily., Disp: 90 tablet, Rfl: 3   Cholecalciferol 1.25 MG (50000 UT) capsule, Take 1 capsule (50,000 Units total) by mouth once a week. d3, Disp: 13 capsule, Rfl: 1   clotrimazole (CLOTRIMAZOLE AF) 1 % cream, Apply 1 Application topically 2 (two) times daily. as needed weekly 1-2 weeks, Disp: 60 g, Rfl: 2   desonide (DESOWEN) 0.05 % cream, Apply topically 2 (two) times daily. To eyelids and under eyes as needed, Disp: 60 g, Rfl: 11   doxycycline (VIBRA-TABS) 100 MG tablet, Take 1 tablet by mouth 2  times daily. 7-10 days with food, Disp: 20 tablet, Rfl: 0   fluconazole (DIFLUCAN) 150 MG tablet, Take 1 tablet (150 mg total) by mouth once a week. Weekly x 2-4 weeks, Disp: 4 tablet, Rfl: 0   hydrocortisone (ANUSOL-HC) 2.5 % rectal cream, Place 1 application rectally 2 (two) times daily., Disp: 30 g, Rfl: 11   hydrocortisone 2.5 % ointment, Apply topically 2 (two) times daily as needed 1-2 weeks, Disp: 60  g, Rfl: 2   levocetirizine (XYZAL) 5 MG tablet, TAKE 1 TABLET BY MOUTH EVERY EVENING., Disp: 90 tablet, Rfl: 3   Lidocaine, Anorectal, 5 % CREA, Apply topically 2 times daily as needed., Disp: 30 g, Rfl: 11   montelukast (SINGULAIR) 10 MG tablet, TAKE 1 TABLET BY MOUTH AT BEDTIME., Disp: 90 tablet, Rfl: 3   mupirocin ointment (BACTROBAN) 2 %, Apply 1 application topically 2 (two) times daily., Disp: 22 g, Rfl: 0   pimecrolimus (ELIDEL) 1 % cream, Apply  topically 2 (two) times daily., Disp: 30 g, Rfl: 0   spironolactone (ALDACTONE) 50 MG tablet, Take 1 tablet by mouth once a day, Disp: 30 tablet, Rfl: 3   tretinoin (RETIN-A) 0.05 % cream, Apply a pea sized amount to face and neck at bedtime., Disp: 45 g, Rfl: 5   triamcinolone cream (KENALOG) 0.1 %, Apply 1 application topically 2 (two) times daily., Disp: 30 g, Rfl: 0   fluticasone (FLONASE) 50 MCG/ACT nasal spray, PLACE 2 SPRAYS INTO BOTH NOSTRILS DAILY., Disp: 16 g, Rfl: 11  EXAM:  VITALS per patient if applicable:  GENERAL: alert, oriented, appears well and in no acute distress    PSYCH/NEURO: pleasant and cooperative, no obvious depression or anxiety, speech and thought processing grossly intact  Skin: see my chart message like intertrigo ASSESSMENT AND PLAN:  Discussed the following assessment and plan:  Intertrigo - Plan: fluconazole (DIFLUCAN) 150 MG tablet, hydrocortisone 2.5 % ointment, clotrimazole (CLOTRIMAZOLE AF) 1 % cream Gold bond talc free powder   -we discussed possible serious and likely etiologies, options for evaluation and workup, limitations of telemedicine visit vs in person visit, treatment, treatment risks and precautions. Pt is agreeable to treatment via telemedicine at this moment.   I discussed the assessment and treatment plan with the patient. The patient was provided an opportunity to ask questions and all were answered. The patient agreed with the plan and demonstrated an understanding of the instructions.    No charge  Delorise Jackson, MD

## 2022-03-12 ENCOUNTER — Ambulatory Visit (INDEPENDENT_AMBULATORY_CARE_PROVIDER_SITE_OTHER): Payer: No Typology Code available for payment source | Admitting: *Deleted

## 2022-03-12 DIAGNOSIS — J309 Allergic rhinitis, unspecified: Secondary | ICD-10-CM

## 2022-03-12 NOTE — Progress Notes (Signed)
Immunotherapy   Patient Details  Name: Amanda Blackburn MRN: [id number] Date of Birth: Jun 25, 2001  03/12/2022  [name] started injections for  [facility] Following schedule: B  Frequency:1 time per week Epi-Pen:Epi-Pen Available  Consent signed and patient instructions given. Patient re-started allergy injections and received 0.68mL of Pollen-Cat in the RUA and 0.30mL of mold-Mite in the LUA. Patient waited 30 minutes in office and did not experience any issues.   Samanthan Dugo Fernandez-Vernon 03/12/2022, 4:44 PM

## 2022-03-13 ENCOUNTER — Other Ambulatory Visit (HOSPITAL_COMMUNITY): Payer: Self-pay

## 2022-03-14 ENCOUNTER — Other Ambulatory Visit (HOSPITAL_COMMUNITY): Payer: Self-pay

## 2022-03-26 ENCOUNTER — Encounter: Payer: Self-pay | Admitting: Internal Medicine

## 2022-03-26 ENCOUNTER — Other Ambulatory Visit (HOSPITAL_COMMUNITY): Payer: Self-pay

## 2022-03-26 ENCOUNTER — Other Ambulatory Visit: Payer: Self-pay | Admitting: Internal Medicine

## 2022-03-26 ENCOUNTER — Ambulatory Visit (INDEPENDENT_AMBULATORY_CARE_PROVIDER_SITE_OTHER): Payer: No Typology Code available for payment source | Admitting: Internal Medicine

## 2022-03-26 ENCOUNTER — Other Ambulatory Visit (HOSPITAL_COMMUNITY)
Admission: RE | Admit: 2022-03-26 | Discharge: 2022-03-26 | Disposition: A | Discharge status: Home / Self Care | Payer: No Typology Code available for payment source | Source: Ambulatory Visit | Attending: Internal Medicine | Admitting: Internal Medicine

## 2022-03-26 VITALS — BP 118/60 | HR 109 | Temp 98.6°F | Ht 63.5 in | Wt 157.2 lb

## 2022-03-26 DIAGNOSIS — N760 Acute vaginitis: Secondary | ICD-10-CM | POA: Diagnosis not present

## 2022-03-26 DIAGNOSIS — L739 Follicular disorder, unspecified: Secondary | ICD-10-CM

## 2022-03-26 DIAGNOSIS — J029 Acute pharyngitis, unspecified: Secondary | ICD-10-CM

## 2022-03-26 DIAGNOSIS — Z20822 Contact with and (suspected) exposure to covid-19: Secondary | ICD-10-CM

## 2022-03-26 DIAGNOSIS — J0121 Acute recurrent ethmoidal sinusitis: Secondary | ICD-10-CM

## 2022-03-26 LAB — POCT RAPID STREP A (OFFICE): Rapid Strep A Screen: NEGATIVE

## 2022-03-26 MED ORDER — CLINDAMYCIN PHOSPHATE 1 % EX SOLN
Freq: Two times a day (BID) | CUTANEOUS | 11 refills | Status: DC
  Filled 2022-03-26 (×2): qty 60, 30d supply, fill #0

## 2022-03-26 MED ORDER — AZITHROMYCIN 250 MG PO TABS
ORAL_TABLET | ORAL | 0 refills | Status: AC
  Filled 2022-03-26: qty 6, 5d supply, fill #0

## 2022-03-26 NOTE — Patient Instructions (Signed)

## 2022-03-26 NOTE — Progress Notes (Signed)
No chief complaint on file.  F/u  1.vaginal folliculitis recurrent appt derm 04/15/22 doing warm compresses and areas will burse  2. Intertrigo improving but has rash under arms using degree deoderant needs to change to another  3. Sinus pressure and nasal congestion and PND strep negative will test for covid  Asthma and allergies not controlled on NS, flonase, zyrtec and singulair upcoming appt with allergy with eczema and asthma and allergies rec disc dupixent  4. Vaginal itching    Review of Systems  Constitutional:  Negative for weight loss.  HENT:  Positive for congestion. Negative for hearing loss.   Eyes:  Negative for blurred vision.  Respiratory:  Negative for shortness of breath.   Cardiovascular:  Negative for chest pain.  Gastrointestinal:  Negative for abdominal pain and blood in stool.  Genitourinary:  Negative for dysuria.       Vaginal itching  Musculoskeletal:  Negative for falls and joint pain.  Skin:  Positive for itching and rash.  Neurological:  Negative for headaches.  Psychiatric/Behavioral:  Negative for depression.    Past Medical History:  Diagnosis Date   Allergy    Anxiety    Asthma    Depression    sees therapy    Eczema    Frequent headaches    Seasonal allergies    Past Surgical History:  Procedure Laterality Date   NO PAST SURGERIES     Family History  Problem Relation Age of Onset   Asthma Mother    Diabetes Mother    Hypertension Father    Asthma Sister    Social History   Socioeconomic History   Marital status: Single    Spouse name: Not on file   Number of children: Not on file   Years of education: Not on file   Highest education level: Not on file  Occupational History   Not on file  Tobacco Use   Smoking status: Never   Smokeless tobacco: Never  Vaping Use   Vaping Use: Never used  Substance and Sexual Activity   Alcohol use: No   Drug use: No   Sexual activity: Yes    Birth control/protection: I.U.D.  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   Single as of 08/05/3783    No guns    Wears seat belt   Safe in relationship    No etoh or drugs    She wants to be a Engineer, manufacturing systems   Works [employer] in [place] Strain: Not on [employer] Insecurity: Not on file  Transportation Needs: Not on file  Physical Activity: Not on file  Stress: Not on file  Social Connections: Not on file  Intimate Partner Violence: Not on file   Current Meds  Medication Sig   azithromycin (ZITHROMAX) 250 MG tablet Take 2 tablets on day 1, then 1 tablet daily on days 2 through 5   clindamycin (CLEOCIN T) 1 % lotion Apply topically 2 (two) times daily.   Allergies  Allergen Reactions   Covid-19 (Mrna) Vaccine Diarrhea and Nausea And Vomiting    Fever   Hydroxyzine     In a daze   Prozac [Fluoxetine]     Note effective     Nickel Rash    Rash    Sulfa Antibiotics Rash  Rash    Recent Results (from the past 2160 hour(s))  Cervicovaginal ancillary only( [place] [facility])     Status: None   Collection Time: 01/05/22  5:10 PM  Result Value Ref Range   Neisseria Gonorrhea Negative    Chlamydia Negative    Trichomonas Negative    Bacterial Vaginitis (gardnerella) Negative    Candida Vaginitis Negative    Candida Glabrata Negative    Comment      Normal Reference Range Bacterial Vaginosis - Negative   Comment Normal Reference Range Candida Species - Negative    Comment Normal Reference Range Candida Galbrata - Negative    Comment Normal Reference Range Trichomonas - Negative    Comment Normal Reference [place] Chlamydia - Negative    Comment      Normal Reference Range Neisseria Gonorrhea - Negative  Cytology - PAP( [place])     Status: Abnormal   Collection Time: 01/05/22  5:10 PM  Result Value Ref Range   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   Diagnosis (A)     - Atypical squamous cells of undetermined significance (ASC-US)   Comment Normal Reference Range HPV - Negative   POCT rapid strep A     Status: Normal   Collection Time: 03/26/22  8:30 AM  Result Value Ref Range   Rapid Strep A Screen Negative Negative   Objective  Body mass index is 27.41 kg/m. Wt Readings from Last 3 Encounters:  03/26/22 157 lb 3.2 oz (71.3 kg)  03/11/22 157 lb (71.2 kg)  03/06/22 157 lb 9.6 oz (71.5 kg)   Temp Readings from Last 3 Encounters:  03/26/22 98.6 F (37 C) (Oral)  03/06/22 98.6 F (37 C) (Oral)  02/04/22 98.2 F (36.8 C) (Temporal)   BP Readings from Last 3 Encounters:  03/26/22 118/60  03/06/22 118/60  02/16/22 110/73   Pulse Readings from Last 3 Encounters:  03/26/22 (!) 109  03/06/22 77  02/16/22 80    Physical Exam Vitals and nursing note reviewed.  Constitutional:      Appearance: Normal appearance. She is well-developed and well-groomed.  HENT:     Head: Normocephalic and atraumatic.     Nose:     Comments: +ethmoid ttp Eyes:     Conjunctiva/sclera: Conjunctivae normal.     Pupils: Pupils are equal, round, and reactive to light.  Cardiovascular:     Rate and Rhythm: Normal rate and regular rhythm.     Heart sounds: Normal heart sounds. No murmur heard. Pulmonary:     Effort: Pulmonary effort is normal.     Breath sounds: Normal breath sounds.  Abdominal:     General: Abdomen is flat. Bowel sounds are normal.     Tenderness: There is no abdominal tenderness.  Genitourinary:   Musculoskeletal:        General: No tenderness.  Skin:    General: Skin is warm and dry.  Neurological:     General: No focal deficit present.     Mental Status: She is alert and oriented to person, place, and time. Mental status is at baseline.     Cranial Nerves: Cranial nerves 2-12 are intact.     Motor: Motor function is intact.     Coordination:  Coordination is intact.     Gait: Gait is intact.  Psychiatric:        Attention and Perception: Attention and perception normal.  Mood and Affect: Mood and affect normal.        Speech: Speech normal.        Behavior: Behavior normal. Behavior is cooperative.        Thought Content: Thought content normal.        Cognition and Memory: Cognition and memory normal.        Judgment: Judgment normal.     Assessment  Plan  Sore throat - Plan: POCT rapid strep A, neg Novel Coronavirus, NAA ([facility]), Cont allergy meds  With sinusitis add zpack with ns, flonase, zyrtec in am and pm and singulair    Acute vaginitis - Plan: Urine cytology ancillary only([place])  Folliculitis - Plan: clindamycin (CLEOCIN T) 1 % lotion F/u derm  Acute recurrent ethmoidal sinusitis - Plan: azithromycin (ZITHROMAX) 250 MG tablet  Ask allergy/asthma about dupixent   HM  See last visit   Provider: Dr. French Ana McLean-Scocuzza-Internal Medicine

## 2022-03-27 LAB — URINE CYTOLOGY ANCILLARY ONLY
Bacterial Vaginitis-Urine: NEGATIVE
Candida Urine: NEGATIVE
Chlamydia: NEGATIVE
Comment: NEGATIVE
Comment: NEGATIVE
Comment: NORMAL
Neisseria Gonorrhea: NEGATIVE
Trichomonas: NEGATIVE

## 2022-03-27 LAB — NOVEL CORONAVIRUS, NAA: SARS-CoV-2, NAA: NOT DETECTED

## 2022-04-01 ENCOUNTER — Ambulatory Visit: Payer: No Typology Code available for payment source | Admitting: Internal Medicine

## 2022-04-13 ENCOUNTER — Other Ambulatory Visit (HOSPITAL_COMMUNITY): Payer: Self-pay

## 2022-04-16 ENCOUNTER — Encounter: Payer: No Typology Code available for payment source | Admitting: Family Medicine

## 2022-04-17 ENCOUNTER — Ambulatory Visit (INDEPENDENT_AMBULATORY_CARE_PROVIDER_SITE_OTHER): Payer: No Typology Code available for payment source | Admitting: Internal Medicine

## 2022-04-17 ENCOUNTER — Other Ambulatory Visit: Payer: Self-pay

## 2022-04-17 ENCOUNTER — Encounter: Payer: Self-pay | Admitting: Internal Medicine

## 2022-04-17 VITALS — BP 110/64 | HR 80 | Temp 98.2°F | Resp 16 | Ht 63.5 in | Wt 159.6 lb

## 2022-04-17 DIAGNOSIS — L2389 Allergic contact dermatitis due to other agents: Secondary | ICD-10-CM

## 2022-04-17 DIAGNOSIS — J302 Other seasonal allergic rhinitis: Secondary | ICD-10-CM

## 2022-04-17 DIAGNOSIS — H1045 Other chronic allergic conjunctivitis: Secondary | ICD-10-CM

## 2022-04-17 DIAGNOSIS — J3089 Other allergic rhinitis: Secondary | ICD-10-CM | POA: Diagnosis not present

## 2022-04-17 DIAGNOSIS — J4520 Mild intermittent asthma, uncomplicated: Secondary | ICD-10-CM

## 2022-04-17 DIAGNOSIS — T781XXD Other adverse food reactions, not elsewhere classified, subsequent encounter: Secondary | ICD-10-CM

## 2022-04-17 DIAGNOSIS — H1013 Acute atopic conjunctivitis, bilateral: Secondary | ICD-10-CM

## 2022-04-17 NOTE — Progress Notes (Signed)
Follow Up Note  RE: Amanda Blackburn MRN: [id number] DOB: Nov 18, 2000 Date of Office Visit: 04/17/2022  Referring provider: McLean-Scocuzza, Olivia Mackie * Primary care provider: McLean-Scocuzza, Nino Glow, MD  Chief Complaint: Eczema and Asthma  History of Present Illness: I had the pleasure of seeing Amanda Blackburn for a follow up visit at the [facility] on 04/17/2022. She is a 21 y.o. female, who is being followed for intermittent asthma, contact dermatitis, allergic rhinitis, pollen food syndrome. Her previous allergy office visit was on 02/04/2022 with Dr. Edison Pace. Today is a regular follow up visit.  History obtained from patient, chart review and .  ASTHMA - Medical therapy: singulair 10mg  daily, albuterol as needed - Rescue inhaler use: rare use - Symptoms: mild increase cough with change in weather  - Exacerbation history: 0 ABX for respiratory illness since last visit, 0 OCS, 0ED, 0 UC visits in the past year  - ACT: 21 /25 - Adverse effects of medication: denies  - Previous FEV1: 2.33 L, 71% - Biologic Labs not done  Also complaining of chapped irritated lips.  She attributes this to reaction to certain problems.  She has been able to find one version (Dr. Kathaleen Grinder)  that she can use but is interested in finding out the underlying allergen.  At the same time she has noted eczematous rash on her neck which has been treating with hydrocortisone.  She has not been ointment to schedule her first AIT injection but she does still want to proceed.  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  This is her main reason for restarting AIT.  Her vials have not been mixed but we have reordered them.  She continues take Zyrtec, Flonase and Singulair with fairly good control of her rhinitis symptoms.  She has been busy with a research paper which is due at the end of the month.  After that she believes she will be able to be compliant with  allergy injections  Assessment and Plan: Petrina is a 21 y.o. female with: Mild intermittent asthma in adult without complication  Seasonal and perennial allergic rhinitis  Other chronic allergic conjunctivitis of both eyes  Allergic contact dermatitis due to other agents  Pollen-food allergy, subsequent encounter Plan: Patient Instructions  1. Dermatitis  - continue medications as prescribed from Dermatology - Set up patch testing -Patches are best placed on Monday with return to office on Wednesday and Friday of same week for readings.  Patches once placed should not get wet.  You do not have to stop any medications for patch testing but should not be on oral prednisone. You can schedule a patch testing visit when convenient for your schedule.   - Continue hydrocortisone cream twice a day  - Use vanicream and epicream as needed for lotion    2. Mild intermittent asthma  - Continue with montelukast 10mg  daily. - Continue with albuterol 4 puffs every 4-6 hours as needed.   3. Seasonal and perennial allergic rhinitis (grasses, weeds, trees, ragweed, mold, dust mite, cat)  - Continue  Flonase 1 spray per nostril daily  - Continue with Xyzal 5mg  daily. - Continue with Singulair 10mg  daily.  - We will restart your allergy injections, recommend scheduling a AIT first injection appointment   4. Pollen-food allergy - Continue to avoid all triggering foods. - Let us know if you need a new epipen  - Allergy injections will help with this  Good luck with your research paper  Thank you so much for letting me partake in your care today.  Don't hesitate to reach out if you have any additional concerns!  Ferol Luz, MD  [facility] [place] [place]   Please inform us of any Emergency Department visits, hospitalizations, or changes in symptoms. Call us before going to the ED for breathing or allergy symptoms since we might be able to fit you in for a sick visit.  Feel free to contact us anytime with any questions, problems, or concerns.  It was a pleasure to meet you today!  Websites that have reliable patient information: 1. American Academy of Asthma, Allergy, and Immunology: www.aaaai.org 2. Food Allergy Research and Education (FARE): foodallergy.org 3. Mothers of Asthmatics: http://www.asthmacommunitynetwork.org 4. American College of Allergy, Asthma, and Immunology: www.acaai.org    No follow-ups on file.  No orders of the defined types were placed in this encounter.   Lab Orders  No laboratory test(s) ordered today   Diagnostics: Spirometry:  None done    Medication List:  Current Outpatient Medications  Medication Sig Dispense Refill   albuterol (PROAIR HFA) 108 (90 Base) MCG/ACT inhaler Inhale 2 puffs into the lungs every 4 (four) hours as needed for wheezing or shortness of breath. 6.7 g 11   Cholecalciferol 1.25 MG (50000 UT) capsule Take 1 capsule (50,000 Units total) by mouth once a week. d3 13 capsule 1   clindamycin (CLEOCIN T) 1 % external solution Apply topically 2 (two) times daily. 60 mL 11   clotrimazole (CLOTRIMAZOLE AF) 1 % cream Apply 1 Application topically 2 (two) times daily. as needed weekly 1-2 weeks 60 g 2   desonide (DESOWEN) 0.05 % cream Apply topically 2 (two) times daily. To eyelids and under eyes as needed 60 g 11   hydrocortisone (ANUSOL-HC) 2.5 % rectal cream Place 1 application rectally 2 (two) times daily. 30 g 11   hydrocortisone 2.5 % ointment Apply topically 2 (two) times daily as needed 1-2 weeks 56.7 g 2   levocetirizine (XYZAL) 5 MG tablet TAKE 1 TABLET BY MOUTH EVERY EVENING. 90 tablet 3   Lidocaine, Anorectal, 5 % CREA Apply topically 2 times daily as needed. 30 g 11   montelukast (SINGULAIR) 10 MG tablet TAKE 1 TABLET BY MOUTH AT BEDTIME. 90 tablet 3   mupirocin ointment (BACTROBAN) 2 % Apply 1 application topically 2 (two) times daily. 22 g 0   pimecrolimus (ELIDEL) 1 % cream Apply  topically 2 (two) times daily. 30 g 0   spironolactone (ALDACTONE) 50 MG tablet Take 1 tablet by mouth once a day 30 tablet 3   tretinoin (RETIN-A) 0.05 % cream Apply a pea sized amount to face and neck at bedtime. 45 g 5   triamcinolone cream (KENALOG) 0.1 % Apply 1 application topically 2 (two) times daily. 30 g 0   beclomethasone (QVAR REDIHALER) 40 MCG/ACT inhaler Inhale 2 puffs into the lungs 2 (two) times daily. Rinse mouth after use. (Patient not taking: Reported on 04/17/2022) 10.6 g 11   buPROPion (WELLBUTRIN XL) 150 MG 24 hr tablet Take 1 tablet by mouth daily. (Patient not taking: Reported on 04/17/2022) 90 tablet 3   doxycycline (VIBRA-TABS) 100 MG tablet Take 1 tablet by mouth 2  times daily. 7-10 days with food (Patient not taking: Reported on 04/17/2022) 20 tablet 0   fluconazole (DIFLUCAN) 150 MG tablet Take 1 tablet (150 mg total) by mouth once a week. Weekly for 2-4 weeks (  Patient not taking: Reported on 04/17/2022) 4 tablet 0   fluticasone (FLONASE) 50 MCG/ACT nasal spray PLACE 2 SPRAYS INTO BOTH NOSTRILS DAILY. 16 g 11   No current facility-administered medications for this visit.   Allergies: Allergies  Allergen Reactions   Covid-19 (Mrna) Vaccine Diarrhea and Nausea And Vomiting    Fever   Hydroxyzine     In a daze   Prozac [Fluoxetine]     Note effective     Sulfur Other (See Comments)   Nickel Rash    Rash    Sulfa Antibiotics Rash    Rash    I reviewed her past medical history, social history, family history, and environmental history and no significant changes have been reported from her previous visit.  ROS: All others negative except as noted per HPI.   Objective: BP 110/64   Pulse 80   Temp 98.2 F (36.8 C) (Temporal)   Resp 16   Ht 5' 3.5" (1.613 m)   Wt 159 lb 9.6 oz (72.4 kg)   SpO2 98%   BMI 27.83 kg/m  Body mass index is 27.83 kg/m. General Appearance:  Alert, cooperative, no distress, appears stated age  Head:  Normocephalic, without  obvious abnormality, atraumatic  Eyes:  Conjunctiva clear, EOM's intact  Nose: Nares normal, hypertrophic turbinates, no visible anterior polyps, and septum midline  Throat: Lips, tongue normal; teeth and gums normal, normal posterior oropharynx and no tonsillar exudate  Neck: Supple, symmetrical  Lungs:   clear to auscultation bilaterally, Respirations unlabored, no coughing  Heart:  regular rate and rhythm and no murmur, Appears well perfused  Extremities: No edema  Skin: Skin color, texture, turgor normal, "eczematous patches on neck and chest  Neurologic: No gross deficits   Previous notes and tests were reviewed. The plan was reviewed with the patient/family, and all questions/concerned were addressed.  It was my pleasure to see Emiah today and participate in her care. Please feel free to contact me with any questions or concerns.  Sincerely,  Ferol Luz, MD  Allergy & Immunology  [facility] Office: [phone]

## 2022-04-17 NOTE — Patient Instructions (Addendum)
1. Dermatitis  - continue medications as prescribed from Dermatology - Set up patch testing -Patches are best placed on Monday with return to office on Wednesday and Friday of same week for readings.  Patches once placed should not get wet.  You do not have to stop any medications for patch testing but should not be on oral prednisone. You can schedule a patch testing visit when convenient for your schedule.   - Continue hydrocortisone cream twice a day  - Use vanicream and epicream as needed for lotion    2. Mild intermittent asthma  - Continue with montelukast 10mg  daily. - Continue with albuterol 4 puffs every 4-6 hours as needed.   3. Seasonal and perennial allergic rhinitis (grasses, weeds, trees, ragweed, mold, dust mite, cat)  - Continue  Flonase 1 spray per nostril daily  - Continue with Xyzal 5mg  daily. - Continue with Singulair 10mg  daily.  - We will restart your allergy injections, recommend scheduling a AIT first injection appointment   4. Pollen-food allergy - Continue to avoid all triggering foods. - Let us know if you need a new epipen  - Allergy injections will help with this    Good luck with your research paper  Thank you so much for letting me partake in your care today.  Don't hesitate to reach out if you have any additional concerns!  Roney Marion, MD  [facility] [place] [place]   Please inform us of any Emergency Department visits, hospitalizations, or changes in symptoms. Call us before going to the ED for breathing or allergy symptoms since we might be able to fit you in for a sick visit. Feel free to contact us anytime with any questions, problems, or concerns.  It was a pleasure to meet you today!  Websites that have reliable patient information: 1. American Academy of Asthma, Allergy, and Immunology: www.aaaai.org 2. Food Allergy Research and Education (FARE): foodallergy.org 3. Mothers of Asthmatics:  http://www.asthmacommunitynetwork.org 4. American College of Allergy, Asthma, and Immunology: www.acaai.org

## 2022-04-27 ENCOUNTER — Encounter: Payer: Self-pay | Admitting: Family Medicine

## 2022-04-27 ENCOUNTER — Ambulatory Visit (INDEPENDENT_AMBULATORY_CARE_PROVIDER_SITE_OTHER): Payer: No Typology Code available for payment source | Admitting: Family Medicine

## 2022-04-27 VITALS — BP 100/60 | HR 95 | Temp 98.0°F | Resp 12 | Wt 157.6 lb

## 2022-04-27 DIAGNOSIS — L235 Allergic contact dermatitis due to other chemical products: Secondary | ICD-10-CM | POA: Diagnosis not present

## 2022-04-27 NOTE — Progress Notes (Signed)
Follow-up Note  RE: Ree Alcalde MRN: [id number] DOB: [birth year] Date of Office Visit: 04/27/2022  Primary care provider: McLean-Scocuzza, Nino Glow, MD Referring provider: McLean-Scocuzza, Wynn Maudlin returns to the office today for the patch test placement, given suspected history of contact dermatitis. She reports that she continues to experience red, itchy and occasionally raised rashes that occur in a flare and remission pattern. She reports that she feels well overall today. Her current medications are listed in the chart.    Diagnostics: True Test patches placed.   Plan:   Allergic contact dermatitis - Instructions provided on care of the patches for the next 48 hours. - Jerrye was instructed to avoid showering for the next 48 hours. - Keshia will follow up in 48 hours and 96 hours for patch readings.    Call the clinic if this treatment plan is not working well for you  Follow up in 2 days or sooner if needed.

## 2022-04-27 NOTE — Patient Instructions (Signed)
  Diagnostics: True Test patches placed.   Plan:   Allergic contact dermatitis - Instructions provided on care of the patches for the next 48 hours. - Amanda Blackburn was instructed to avoid showering for the next 48 hours. - Amanda Blackburn will follow up in 48 hours and 96 hours for patch readings.    Call the clinic if this treatment plan is not working well for you  Follow up in 2 days or sooner if needed.

## 2022-04-28 NOTE — Progress Notes (Signed)
   Follow Up Note  RE: Amanda Blackburn MRN: [id number] DOB: 04-21-2001 Date of Office Visit: 04/29/2022  Referring provider: McLean-Scocuzza, French Ana * Primary care provider: McLean-Scocuzza, Pasty Spillers, MD  History of Present Illness: I had the pleasure of seeing Amanda Blackburn for a follow up visit at the [facility] on 04/29/2022. She is a 21 y.o. female, who is being followed for dermatitis. Today she is here for initial patch test interpretation, given suspected history of contact dermatitis.   Diagnostics:  TRUE TEST 48 hour reading:   T.R.U.E. Test - 04/29/22 1100       Test Information   Time Antigen Placed 1125    Manufacturer Greer    Lot # [id number]    Location Back    Number of Test 36    Reading Interval Day 3    Panel Panel 1;Panel 2;Panel 3      Panel 1   1. Nickel Sulfate 0    2. Wool Alcohols 0    3. Neomycin Sulfate 0    4. Potassium Dichromate 0    5. Caine Mix 0    6. Fragrance Mix 0    7. Colophony 0    8. Paraben Mix 0    9. Negative Control 0    10. Balsam of [place] 0    11. Ethylenediamine Dihydrochloride 0    12. Cobalt Dichloride 0      Panel 2   13. p-tert Butylphenol Formaldehyde Resin 0    14. Epoxy Resin 0    15. Carba Mix 0    16.  Black Rubber Mix 0    17. Cl+ Me-Isothiazolinone 0    18. Quaternium-15 0    19. Methyldibromo Glutaronitrile 0    20. p-Phenylenediamine 0    21. Formaldehyde 0    22. Mercapto Mix 0    23. Thimerosal 0    24. Thiuram Mix 0      Panel 3   25. Diazolidinyl Urea 0    26. Quinoline Mix 0    27. Tixocortol-21-Pivalate 0    28. Gold Sodium Thiosulfate 0    29. Imidazolidinyl Urea 0    30. Budesonide 0    31. Hydrocortisone-17-Butyrate 0    32. Mercaptobenzothiazole 0    33. Bacitracin 0    34. Parthenolide 0    35. Disperse Blue 106 0    36. 2-Bromo-2-Nitropropane-1,3-diol 0              Assessment and Plan: Amanda Blackburn is a 21 y.o. female with: Allergic contact dermatitis due to other  agents TRUE patches removed.  First reading - all negative.  Return in about 2 days (around 05/01/2022) for Patch reading.  It was my pleasure to see Amanda Blackburn today and participate in her care. Please feel free to contact me with any questions or concerns.  Sincerely,  Wyline Mood, DO Allergy & Immunology  [facility] office: [phone] [facility] office: [phone] [place] office: [phone]

## 2022-04-29 ENCOUNTER — Ambulatory Visit (INDEPENDENT_AMBULATORY_CARE_PROVIDER_SITE_OTHER): Payer: No Typology Code available for payment source | Admitting: Allergy

## 2022-04-29 ENCOUNTER — Ambulatory Visit (INDEPENDENT_AMBULATORY_CARE_PROVIDER_SITE_OTHER): Payer: No Typology Code available for payment source

## 2022-04-29 ENCOUNTER — Encounter: Payer: Self-pay | Admitting: Allergy

## 2022-04-29 DIAGNOSIS — J309 Allergic rhinitis, unspecified: Secondary | ICD-10-CM

## 2022-04-29 DIAGNOSIS — L2389 Allergic contact dermatitis due to other agents: Secondary | ICD-10-CM

## 2022-04-29 NOTE — Assessment & Plan Note (Signed)
TRUE patches removed.   First reading - all negative.

## 2022-05-01 ENCOUNTER — Other Ambulatory Visit (HOSPITAL_COMMUNITY): Payer: Self-pay

## 2022-05-01 ENCOUNTER — Ambulatory Visit (INDEPENDENT_AMBULATORY_CARE_PROVIDER_SITE_OTHER): Payer: No Typology Code available for payment source | Admitting: Internal Medicine

## 2022-05-01 ENCOUNTER — Encounter: Payer: Self-pay | Admitting: Internal Medicine

## 2022-05-01 DIAGNOSIS — L2389 Allergic contact dermatitis due to other agents: Secondary | ICD-10-CM | POA: Diagnosis not present

## 2022-05-01 DIAGNOSIS — T781XXD Other adverse food reactions, not elsewhere classified, subsequent encounter: Secondary | ICD-10-CM

## 2022-05-01 MED ORDER — PIMECROLIMUS 1 % EX CREA
TOPICAL_CREAM | Freq: Two times a day (BID) | CUTANEOUS | 2 refills | Status: DC | PRN
  Filled 2022-05-01: qty 30, 15d supply, fill #0

## 2022-05-01 MED ORDER — LEVOCETIRIZINE DIHYDROCHLORIDE 5 MG PO TABS
5.0000 mg | ORAL_TABLET | Freq: Every evening | ORAL | 3 refills | Status: DC
  Filled 2022-05-01: qty 90, fill #0
  Filled 2022-06-10: qty 90, 90d supply, fill #0

## 2022-05-01 NOTE — Patient Instructions (Signed)
Concern for Contact Dermatitis: - Patch testing today was negative. Discussed possibility of false negatives and limitations of agents tested.  Our practice has considered starting extended patch testing but there is no clear understanding of when we would be starting that at this time and could take years.  She is interested in extended patch testing when available.  For now, she is to continue the creams as instructed by Dr. Edison Pace with Elidel/Hydrocortisone PRN.  She is currently using the Elidel PRN.   If symptoms persist or progress despite meticulous avoidance of chemicals/substances above, dermatology evaluation may be warranted.  PFAS - Continue AIT per schedule - Use Xyzal as needed.   - Eat processed or heated vegetables/fruits.

## 2022-05-01 NOTE — Progress Notes (Signed)
Follow Up Note  RE: Amanda Blackburn MRN: [id number] DOB: Dec 28, 2000 Date of Office Visit: 05/01/2022  Referring provider: McLean-Scocuzza, French Ana * Primary care provider: McLean-Scocuzza, Pasty Spillers, MD  History of Present Illness: I had the pleasure of seeing Amanda Blackburn for a follow up visit at the [facility] [place] on 05/01/2022. Amanda Blackburn is a 21 y.o. female, who is being followed for dermatitis. Today Amanda Blackburn is here for final patch test interpretation, given suspected history of contact dermatitis.   Reports her lip rash has not reoccured since last visit. Amanda Blackburn does have Elidel to use PRN.  Amanda Blackburn also has hydrocortisone PRN.  But again has not needed those.  Amanda Blackburn would like refills on the Elidel.   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  Amanda Blackburn is having trouble finding healthy foods and recently had to use her Xyzal due to mucosal itching after eating fresh fruits/veggies.  Amanda Blackburn would like refills on this and hopes her AIT will help with the PFAS also.  Diagnostics:  TRUE TEST 96 hour reading:   T.R.U.E. Test - 05/01/22 1100       Test Information   Time Antigen Placed 1125    Manufacturer Greer    Lot # [id number]    Location Back    Number of Test 36    Reading Interval Day 3    Panel Panel 1;Panel 2;Panel 3      Panel 1   1. Nickel Sulfate 0    2. Wool Alcohols 0    3. Neomycin Sulfate 0    4. Potassium Dichromate 0    5. Caine Mix 0    6. Fragrance Mix 0    7. Colophony 0    8. Paraben Mix 0    9. Negative Control 0    10. Balsam of [place] 0    11. Ethylenediamine Dihydrochloride 0    12. Cobalt Dichloride 0      Panel 2   13. p-tert Butylphenol Formaldehyde Resin 0    14. Epoxy Resin 0    15. Carba Mix 0    16.  Black Rubber Mix 0    17. Cl+ Me-Isothiazolinone 0    18. Quaternium-15 0    19. Methyldibromo Glutaronitrile 0    20. p-Phenylenediamine 0    21. Formaldehyde 0    22. Mercapto Mix 0    23. Thimerosal 0    24. Thiuram Mix 0       Panel 3   25. Diazolidinyl Urea 0    26. Quinoline Mix 0    27. Tixocortol-21-Pivalate 0    28. Gold Sodium Thiosulfate 0    29. Imidazolidinyl Urea 0    30. Budesonide 0    31. Hydrocortisone-17-Butyrate 0    32. Mercaptobenzothiazole 0    33. Bacitracin 0    34. Parthenolide 0    35. Disperse Blue 106 0    36. 2-Bromo-2-Nitropropane-1,3-diol 0              Assessment and Plan: Amanda Blackburn is a 22 y.o. female with: Concern for Contact Dermatitis: - Patch testing today was negative. Discussed possibility of false negatives and limitations of agents tested.  Our practice has considered starting extended patch testing but there is no clear understanding of when we would be starting that at this time and could take years.  Amanda Blackburn is interested in extended patch testing when available.  For now, Amanda Blackburn is to  continue the creams as instructed by Dr. Edison Pace with Elidel/Hydrocortisone PRN.  Amanda Blackburn is currently using the Elidel PRN.   If symptoms persist or progress despite meticulous avoidance of chemicals/substances above, dermatology evaluation may be warranted.  PFAS - Continue AIT per schedule - Use Xyzal as needed.   - Eat processed or heated vegetables/fruits.    Return in about 3 months (around 08/01/2022).  It was my pleasure to see Amanda Blackburn today and participate in her care. Please feel free to contact me with any questions or concerns.  Sincerely,   Harlon Flor, MD [facility]

## 2022-05-04 ENCOUNTER — Other Ambulatory Visit (HOSPITAL_COMMUNITY): Payer: Self-pay

## 2022-05-07 ENCOUNTER — Ambulatory Visit (INDEPENDENT_AMBULATORY_CARE_PROVIDER_SITE_OTHER): Payer: No Typology Code available for payment source

## 2022-05-07 DIAGNOSIS — J309 Allergic rhinitis, unspecified: Secondary | ICD-10-CM | POA: Diagnosis not present

## 2022-05-08 ENCOUNTER — Other Ambulatory Visit (HOSPITAL_COMMUNITY): Payer: Self-pay

## 2022-05-08 MED ORDER — CVS TARGETED ACNE SPOT 2.5 % EX CREA
TOPICAL_CREAM | CUTANEOUS | 2 refills | Status: AC
  Filled 2022-05-08: qty 21, 30d supply, fill #0

## 2022-05-16 ENCOUNTER — Other Ambulatory Visit (HOSPITAL_COMMUNITY): Payer: Self-pay

## 2022-05-16 ENCOUNTER — Encounter (HOSPITAL_COMMUNITY): Payer: Self-pay | Admitting: *Deleted

## 2022-05-16 ENCOUNTER — Ambulatory Visit (HOSPITAL_COMMUNITY)
Admission: EM | Admit: 2022-05-16 | Discharge: 2022-05-16 | Disposition: A | Discharge status: Home / Self Care | Payer: No Typology Code available for payment source | Attending: Physician Assistant | Admitting: Physician Assistant

## 2022-05-16 DIAGNOSIS — J329 Chronic sinusitis, unspecified: Secondary | ICD-10-CM

## 2022-05-16 DIAGNOSIS — J40 Bronchitis, not specified as acute or chronic: Secondary | ICD-10-CM | POA: Diagnosis not present

## 2022-05-16 DIAGNOSIS — J4521 Mild intermittent asthma with (acute) exacerbation: Secondary | ICD-10-CM | POA: Diagnosis not present

## 2022-05-16 MED ORDER — AMOXICILLIN-POT CLAVULANATE 875-125 MG PO TABS
1.0000 | ORAL_TABLET | Freq: Two times a day (BID) | ORAL | 0 refills | Status: DC
  Filled 2022-05-16: qty 14, 7d supply, fill #0

## 2022-05-16 MED ORDER — PREDNISONE 20 MG PO TABS
40.0000 mg | ORAL_TABLET | Freq: Every day | ORAL | 0 refills | Status: DC
  Filled 2022-05-16: qty 8, 4d supply, fill #0

## 2022-05-16 MED ORDER — PROMETHAZINE-DM 6.25-15 MG/5ML PO SYRP
5.0000 mL | ORAL_SOLUTION | Freq: Three times a day (TID) | ORAL | 0 refills | Status: DC | PRN
  Filled 2022-05-16: qty 118, 8d supply, fill #0

## 2022-05-16 NOTE — ED Triage Notes (Signed)
C/O general malaise, sore throat onset 5 days ago. Now c/o facial and frontal sinus pressure onset 2 days ago. C/O increased pain when bending over; c/o generalized dental pain. Denies fevers.
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
## 2022-05-16 NOTE — Discharge Instructions (Signed)
Start Augmentin to treat infection twice daily for 7 days.  Start prednisone 40 mg in the morning for 4 days.  Do not take NSAIDs with this medication including aspirin, ibuprofen/Advil, naproxen/Aleve.  You can use Tylenol, Mucinex, Flonase for additional symptom relief.  Take Promethazine DM up to 3 times a day.  This will make you sleepy so do not drive or drink alcohol with taking it.  Make sure that you rest and drink plenty of fluid.  Use your albuterol inhaler regularly when you are feeling short of breath or having coughing fits.  If you have any worsening symptoms including chest pain, shortness of breath, worsening cough, fever not responding to medication, nausea/vomiting interfere with oral intake you need to be seen immediately.  If your symptoms do not improve by next week return for reevaluation.

## 2022-06-10 ENCOUNTER — Other Ambulatory Visit: Payer: Self-pay

## 2022-06-10 ENCOUNTER — Other Ambulatory Visit (HOSPITAL_COMMUNITY): Payer: Self-pay

## 2022-06-10 ENCOUNTER — Ambulatory Visit (INDEPENDENT_AMBULATORY_CARE_PROVIDER_SITE_OTHER): Payer: No Typology Code available for payment source

## 2022-06-10 DIAGNOSIS — J309 Allergic rhinitis, unspecified: Secondary | ICD-10-CM | POA: Diagnosis not present

## 2022-06-10 MED ORDER — SPIRONOLACTONE 50 MG PO TABS
50.0000 mg | ORAL_TABLET | Freq: Every day | ORAL | 10 refills | Status: DC
  Filled 2022-06-10: qty 30, 30d supply, fill #0
  Filled 2022-07-17 – 2022-07-30 (×2): qty 30, 30d supply, fill #1
  Filled 2022-09-04: qty 30, 30d supply, fill #2

## 2022-06-11 ENCOUNTER — Other Ambulatory Visit (HOSPITAL_COMMUNITY): Payer: Self-pay

## 2022-07-02 ENCOUNTER — Ambulatory Visit (INDEPENDENT_AMBULATORY_CARE_PROVIDER_SITE_OTHER): Payer: 59

## 2022-07-02 DIAGNOSIS — J309 Allergic rhinitis, unspecified: Secondary | ICD-10-CM | POA: Diagnosis not present

## 2022-07-09 ENCOUNTER — Ambulatory Visit (INDEPENDENT_AMBULATORY_CARE_PROVIDER_SITE_OTHER): Payer: 59 | Admitting: Obstetrics and Gynecology

## 2022-07-09 ENCOUNTER — Encounter: Payer: Self-pay | Admitting: Obstetrics and Gynecology

## 2022-07-09 VITALS — BP 100/60 | HR 72 | Wt 164.0 lb

## 2022-07-09 DIAGNOSIS — Z3009 Encounter for other general counseling and advice on contraception: Secondary | ICD-10-CM

## 2022-07-09 NOTE — Progress Notes (Signed)
22 yo P0 here for IUD check up. Patient has had the IUD in place since 2022. She reports irregular bleeding since its insertion and dysmenorrhea. She reports daily migraines. Patient is interested in IUD removal and plans to continue using condoms for contraception. She has tried other forms of hormonal contraception in the past and is not interested in going back to them again.   Past Medical History:  Diagnosis Date   Allergy    Anxiety    Asthma    Depression    sees therapy    Eczema    Frequent headaches    Seasonal allergies    Past Surgical History:  Procedure Laterality Date   NO PAST SURGERIES     Family History  Problem Relation Age of Onset   Asthma Mother    Diabetes Mother    Hypertension Father    Asthma Sister    Social History   Tobacco Use   Smoking status: Never   Smokeless tobacco: Never  Vaping Use   Vaping Use: Never used  Substance Use Topics   Alcohol use: No   Drug use: No   ROS See pertinent in HPI. All other systems reviewed and non contributory Blood pressure 100/60, pulse 72, weight 164 lb (74.4 kg).  GENERAL: Well-developed, well-nourished female in no acute distress.  NEURO: alert and oriented x 3  A/P 22 yo P0 here for contraception consultation - Patient plans to return for IUD removal in the next coming weeks as she is not ready to have it removed today - Will plan to continue using condoms - Patient is taking Spironolactone for acne treatment and understands that it can cause significant birth defects if a pregnancy was to occur. Patient verbalized understanding and states that she uses condoms religiously already - RTC for IUD removal

## 2022-07-13 ENCOUNTER — Ambulatory Visit: Payer: 59 | Admitting: Obstetrics and Gynecology

## 2022-07-23 ENCOUNTER — Encounter: Payer: Self-pay | Admitting: Obstetrics and Gynecology

## 2022-07-23 ENCOUNTER — Ambulatory Visit: Payer: 59 | Admitting: Obstetrics and Gynecology

## 2022-07-23 VITALS — BP 100/66 | HR 65 | Ht 62.5 in | Wt 164.0 lb

## 2022-07-23 DIAGNOSIS — Z30432 Encounter for removal of intrauterine contraceptive device: Secondary | ICD-10-CM

## 2022-07-23 NOTE — Progress Notes (Signed)
Presents for previously planned IUD removal.

## 2022-07-23 NOTE — Progress Notes (Signed)
22 yo here for IUD removal. Patient is without any complaints and plans to use condoms for contraception  Amanda Blackburn is a 22 y.o. G0P0000 here for IUD removal. No GYN concerns.  Last pap smear was on 12/2021 and was abnormal (ASCUS + HPV) with plans to repeat pap smear this summer.  IUD Removal  Patient identified, informed consent performed, consent signed.  Patient was in the dorsal lithotomy position, normal external genitalia was noted.  A speculum was placed in the patient's vagina, normal discharge was noted, no lesions. The cervix was visualized, no lesions, no abnormal discharge.  The strings of the IUD were grasped and pulled using ring forceps. The IUD was removed in its entirety. Patient tolerated the procedure well.    Patient will use condoms for contraception.  Routine preventative health maintenance measures emphasized.

## 2022-07-29 ENCOUNTER — Other Ambulatory Visit (HOSPITAL_COMMUNITY): Payer: Self-pay

## 2022-07-30 ENCOUNTER — Ambulatory Visit (INDEPENDENT_AMBULATORY_CARE_PROVIDER_SITE_OTHER): Payer: 59

## 2022-07-30 ENCOUNTER — Other Ambulatory Visit (HOSPITAL_COMMUNITY): Payer: Self-pay

## 2022-07-30 DIAGNOSIS — J309 Allergic rhinitis, unspecified: Secondary | ICD-10-CM | POA: Diagnosis not present

## 2022-08-07 ENCOUNTER — Other Ambulatory Visit: Payer: Self-pay

## 2022-08-07 ENCOUNTER — Encounter: Payer: Self-pay | Admitting: Internal Medicine

## 2022-08-07 ENCOUNTER — Ambulatory Visit (INDEPENDENT_AMBULATORY_CARE_PROVIDER_SITE_OTHER): Payer: 59 | Admitting: Internal Medicine

## 2022-08-07 ENCOUNTER — Ambulatory Visit: Payer: Self-pay | Admitting: *Deleted

## 2022-08-07 ENCOUNTER — Other Ambulatory Visit (HOSPITAL_COMMUNITY): Payer: Self-pay

## 2022-08-07 VITALS — BP 120/70 | HR 67 | Temp 98.3°F | Resp 18 | Ht 63.5 in | Wt 155.8 lb

## 2022-08-07 DIAGNOSIS — J4520 Mild intermittent asthma, uncomplicated: Secondary | ICD-10-CM

## 2022-08-07 DIAGNOSIS — H1045 Other chronic allergic conjunctivitis: Secondary | ICD-10-CM | POA: Diagnosis not present

## 2022-08-07 DIAGNOSIS — L308 Other specified dermatitis: Secondary | ICD-10-CM | POA: Diagnosis not present

## 2022-08-07 DIAGNOSIS — J302 Other seasonal allergic rhinitis: Secondary | ICD-10-CM

## 2022-08-07 DIAGNOSIS — J309 Allergic rhinitis, unspecified: Secondary | ICD-10-CM | POA: Diagnosis not present

## 2022-08-07 DIAGNOSIS — K9049 Malabsorption due to intolerance, not elsewhere classified: Secondary | ICD-10-CM | POA: Diagnosis not present

## 2022-08-07 DIAGNOSIS — T781XXD Other adverse food reactions, not elsewhere classified, subsequent encounter: Secondary | ICD-10-CM

## 2022-08-07 MED ORDER — MONTELUKAST SODIUM 10 MG PO TABS
10.0000 mg | ORAL_TABLET | Freq: Every day | ORAL | 3 refills | Status: DC
  Filled 2022-08-07: qty 90, 90d supply, fill #0

## 2022-08-07 MED ORDER — LEVOCETIRIZINE DIHYDROCHLORIDE 5 MG PO TABS
5.0000 mg | ORAL_TABLET | Freq: Every evening | ORAL | 3 refills | Status: DC
  Filled 2022-08-07 – 2022-09-04 (×2): qty 90, 90d supply, fill #0

## 2022-08-07 MED ORDER — HYDROCORTISONE 2.5 % EX OINT
TOPICAL_OINTMENT | Freq: Two times a day (BID) | CUTANEOUS | 2 refills | Status: DC
  Filled 2022-08-07: qty 56.7, 14d supply, fill #0

## 2022-08-07 NOTE — Progress Notes (Signed)
Follow Up Note  RE: Amanda Blackburn MRN: [id number] DOB: 04/29/2001 Date of Office Visit: 08/07/2022  Referring provider: McLean-Scocuzza, Olivia Mackie * Primary care provider: Patient, No Pcp Per  Chief Complaint: Asthma (No issues ) and Allergic Rhinitis  (No issues )  History of Present Illness: I had the pleasure of seeing Amanda Blackburn for a follow up visit at the [facility] on 08/07/2022. She is a 22 y.o. female, who is being followed for PFAS, rhinitis and dermatitis. Her previous allergy office visit was on 05/01/22 with  Dr. Posey Pronto . Today is a regular follow up visit.  History obtained from patient, chart review..  Asthma is well-controlled with montelukast 10 mg daily.  Denies any albuterol use except for exercise.  Denies any steroids or antibiotics since last visit.  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  She is on allergy injections for allergic rhinitis and pollen food syndrome.  However she has had issues coming in.  She has a plan in place to be more consistent with her allergy injections that she feels like her symptoms are better controlled when she is more consistent with her injections.  Denies any systemic or local reactions.  She is using her Flonase 1 spray per nostril daily and recently started Xyzal 5 mg daily.  She feels like her pollen food syndrome symptoms are better controlled with Xyzal.  Her dermatitis has resolved.  She is interested in extended allergy testing once available.  She has not needed her Elidel or hydrocortisone in over a month  Pertinent History/Diagnostics:  - Asthma: intermittent   - normal  spirometry (02/04/22): ratio 71%, 81%, 2.33L FEV1 (pre),  -  - Allergic Rhinitis:   - SPT environmental panel (04/01/2019): Positive to grass, weeds, trees, molds, cat, mite  - On AIT, restarted 01/2022, vial 1 (G-T-W-C); vial 2 (M-DM) - PFAS   - Hx of reaction: oral pruritus with  fresh fruits and vegetables   - Lip  Dermatitis   - 11/223: negative True Test   - controlled with elidel/hydrocortisone as needed   - Followed by dermatology     Assessment and Plan: Amanda Blackburn is a 22 y.o. female with: Seasonal and perennial allergic rhinitis  Pollen-food allergy, subsequent encounter - Plan: levocetirizine (XYZAL) 5 MG tablet  Mild intermittent asthma without complication - Plan: montelukast (SINGULAIR) 10 MG tablet, Spirometry with Graph  Other specified dermatitis - Plan: hydrocortisone 2.5 % ointment  Other chronic allergic conjunctivitis of both eyes   Plan: Patient Instructions  1. Dermatitis  - continue Elidel and hydrocortisone as needed  - Will do extended patch testing once it is available    2. Mild intermittent asthma  - Continue with montelukast 6m daily. - Continue with albuterol 4 puffs every 4-6 hours as needed.   3. Seasonal and perennial allergic rhinitis (grasses, weeds, trees, ragweed, mold, dust mite, cat)  - Continue  Flonase 1 spray per nostril daily  - Continue with Xyzal 5102mdaily. - Continue with Singulair 1093maily.  - Continue allergy injections   4. Pollen-food allergy - Continue to avoid all triggering foods. - Let us [place] if you need a new epipen  - Continue xyzal 5mg40mily  - Continue allergy injections   Follow up: 6 months   Thank you so much for letting me partake in your care today.  Don't hesitate to reach out if you have any additional concerns!  Roney Marion, MD  [facility] [place] [place]       Meds ordered this encounter  Medications   levocetirizine (XYZAL) 5 MG tablet    Sig: Take 1 tablet (5 mg total) by mouth every evening.    Dispense:  90 tablet    Refill:  3   montelukast (SINGULAIR) 10 MG tablet    Sig: Take 1 tablet (10 mg total) by mouth at bedtime.    Dispense:  90 tablet    Refill:  3   hydrocortisone 2.5 % ointment    Sig: Apply topically 2 (two) times daily  as needed 1-2 weeks    Dispense:  56.7 g    Refill:  2    Lab Orders  No laboratory test(s) ordered today   Diagnostics: Spirometry:  Tracings reviewed. Her effort: Good reproducible efforts. FVC: 3.32 L FEV1: 2.45 L, 88% predicted FEV1/FVC ratio: 74% Interpretation: Spirometry consistent with normal pattern.  Please see scanned spirometry results for details.    Medication List:  Current Outpatient Medications  Medication Sig Dispense Refill   albuterol (PROAIR HFA) 108 (90 Base) MCG/ACT inhaler Inhale 2 puffs into the lungs every 4 (four) hours as needed for wheezing or shortness of breath. 6.7 g 11   beclomethasone (QVAR REDIHALER) 40 MCG/ACT inhaler Inhale 2 puffs into the lungs 2 (two) times daily. Rinse mouth after use. 10.6 g 11   Benzoyl Peroxide ([employer] TARGETED ACNE SPOT) 2.5 % CREA Apply as needed to active areas 21 g 2   Cholecalciferol 1.25 MG (50000 UT) capsule Take 1 capsule (50,000 Units total) by mouth once a week. d3 13 capsule 1   clindamycin (CLEOCIN T) 1 % external solution Apply topically 2 (two) times daily. 60 mL 11   clotrimazole (CLOTRIMAZOLE AF) 1 % cream Apply 1 Application topically 2 (two) times daily. as needed weekly 1-2 weeks 60 g 2   desonide (DESOWEN) 0.05 % cream Apply topically 2 (two) times daily. To eyelids and under eyes as needed 60 g 11   hydrocortisone (ANUSOL-HC) 2.5 % rectal cream Place 1 application rectally 2 (two) times daily. 30 g 11   Lidocaine, Anorectal, 5 % CREA Apply topically 2 times daily as needed. 30 g 11   mupirocin ointment (BACTROBAN) 2 % Apply 1 application topically 2 (two) times daily. 22 g 0   pimecrolimus (ELIDEL) 1 % cream Apply topically 2 (two) times daily as needed. 30 g 2   predniSONE (DELTASONE) 20 MG tablet Take 2 tablets (40 mg total) by mouth daily. 8 tablet 0   promethazine-dextromethorphan (PROMETHAZINE-DM) 6.25-15 MG/5ML syrup Take 5 mLs by mouth 3 (three) times daily as needed for cough. 118 mL 0    spironolactone (ALDACTONE) 50 MG tablet Take 1 tablet (50 mg total) by mouth daily. 30 tablet 10   tretinoin (RETIN-A) 0.05 % cream Apply a pea sized amount to face and neck at bedtime. 45 g 5   triamcinolone cream (KENALOG) 0.1 % Apply 1 application topically 2 (two) times daily. 30 g 0   fluticasone (FLONASE) 50 MCG/ACT nasal spray PLACE 2 SPRAYS INTO BOTH NOSTRILS DAILY. 16 g 11   hydrocortisone 2.5 % ointment Apply topically 2 (two) times daily as needed 1-2 weeks 56.7 g 2   levocetirizine (XYZAL) 5 MG tablet Take 1 tablet (5 mg total) by mouth every evening. 90 tablet 3   montelukast (SINGULAIR) 10 MG tablet Take 1 tablet (10 mg total) by mouth at bedtime. 90 tablet  3   No current facility-administered medications for this visit.   Allergies: Allergies  Allergen Reactions   Covid-19 (Mrna) Vaccine Diarrhea and Nausea And Vomiting    Fever   Hydroxyzine     In a daze   Prozac [Fluoxetine]     Note effective     Sulfur Other (See Comments)   Nickel Rash    Rash    Sulfa Antibiotics Rash    Rash    I reviewed her past medical history, social history, family history, and environmental history and no significant changes have been reported from her previous visit.  ROS: All others negative except as noted per HPI.   Objective: BP 120/70   Pulse 67   Temp 98.3 F (36.8 C)   Resp 18   Ht 5' 3.5" (1.613 m)   Wt 155 lb 12.8 oz (70.7 kg)   LMP 06/30/2022 (Approximate)   SpO2 98%   BMI 27.17 kg/m  Body mass index is 27.17 kg/m. General Appearance:  Alert, cooperative, no distress, appears stated age  Head:  Normocephalic, without obvious abnormality, atraumatic  Eyes:  Conjunctiva clear, EOM's intact  Nose: Nares normal, normal mucosa, no visible anterior polyps, and septum midline  Throat: Lips, tongue normal; teeth and gums normal, normal posterior oropharynx  Neck: Supple, symmetrical  Lungs:   clear to auscultation bilaterally, Respirations unlabored, no coughing   Heart:  regular rate and rhythm and no murmur, Appears well perfused  Extremities: No edema  Skin: Skin color, texture, turgor normal, no rashes or lesions on visualized portions of skin  Neurologic: No gross deficits   Previous notes and tests were reviewed. The plan was reviewed with the patient/family, and all questions/concerned were addressed.  It was my pleasure to see Amanda Blackburn today and participate in her care. Please feel free to contact me with any questions or concerns.  Sincerely,  Roney Marion, MD  Allergy & Immunology  [facility] Office: [phone]

## 2022-08-07 NOTE — Patient Instructions (Addendum)
1. Dermatitis  - continue Elidel and hydrocortisone as needed  - Will do extended patch testing once it is available    2. Mild intermittent asthma  - Continue with montelukast 50m daily. - Continue with albuterol 4 puffs every 4-6 hours as needed.   3. Seasonal and perennial allergic rhinitis (grasses, weeds, trees, ragweed, mold, dust mite, cat)  - Continue  Flonase 1 spray per nostril daily  - Continue with Xyzal 526mdaily. - Continue with Singulair 1081maily.  - Continue allergy injections   4. Pollen-food allergy - Continue to avoid all triggering foods. - Let us [place] if you need a new epipen  - Continue xyzal 5mg18mily  - Continue allergy injections   Follow up: 6 months   Thank you so much for letting me partake in your care today.  Don't hesitate to reach out if you have any additional concerns!  EvelRoney Marion  [facility]

## 2022-08-10 ENCOUNTER — Other Ambulatory Visit (HOSPITAL_COMMUNITY): Payer: Self-pay

## 2022-08-10 ENCOUNTER — Encounter (HOSPITAL_COMMUNITY): Payer: Self-pay

## 2022-08-10 ENCOUNTER — Ambulatory Visit (HOSPITAL_COMMUNITY)
Admission: EM | Admit: 2022-08-10 | Discharge: 2022-08-10 | Disposition: A | Discharge status: Home / Self Care | Payer: 59 | Attending: Internal Medicine | Admitting: Internal Medicine

## 2022-08-10 DIAGNOSIS — J029 Acute pharyngitis, unspecified: Secondary | ICD-10-CM | POA: Insufficient documentation

## 2022-08-10 DIAGNOSIS — Z1152 Encounter for screening for COVID-19: Secondary | ICD-10-CM | POA: Insufficient documentation

## 2022-08-10 DIAGNOSIS — J028 Acute pharyngitis due to other specified organisms: Secondary | ICD-10-CM | POA: Insufficient documentation

## 2022-08-10 MED ORDER — LIDOCAINE VISCOUS HCL 2 % MT SOLN
15.0000 mL | Freq: Once | OROMUCOSAL | Status: AC
  Administered 2022-08-10: 15 mL via OROMUCOSAL

## 2022-08-10 MED ORDER — LIDOCAINE VISCOUS HCL 2 % MT SOLN
15.0000 mL | Freq: Four times a day (QID) | OROMUCOSAL | 0 refills | Status: DC | PRN
  Filled 2022-08-10: qty 100, 2d supply, fill #0

## 2022-08-10 MED ORDER — BENZONATATE 100 MG PO CAPS
100.0000 mg | ORAL_CAPSULE | Freq: Three times a day (TID) | ORAL | 0 refills | Status: DC
  Filled 2022-08-10: qty 21, 7d supply, fill #0

## 2022-08-10 MED ORDER — LIDOCAINE VISCOUS HCL 2 % MT SOLN
OROMUCOSAL | Status: AC
  Filled 2022-08-10: qty 15

## 2022-08-10 MED ORDER — IBUPROFEN 800 MG PO TABS
800.0000 mg | ORAL_TABLET | Freq: Once | ORAL | Status: AC
  Administered 2022-08-10: 800 mg via ORAL

## 2022-08-10 MED ORDER — IBUPROFEN 600 MG PO TABS
600.0000 mg | ORAL_TABLET | Freq: Four times a day (QID) | ORAL | 0 refills | Status: DC | PRN
  Filled 2022-08-10: qty 30, 8d supply, fill #0

## 2022-08-10 MED ORDER — IBUPROFEN 800 MG PO TABS
ORAL_TABLET | ORAL | Status: AC
  Filled 2022-08-10: qty 1

## 2022-08-10 NOTE — ED Triage Notes (Signed)
Pt is here for sore throat x 2days
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
## 2022-08-10 NOTE — Discharge Instructions (Addendum)
COVID-19 testing is pending and will come back in the next 12 to 24 hours.  Will call you if this is positive.  If COVID-19 testing is positive, you will need to quarantine until day 6 of symptoms, then you may go out into public but you must wear a mask until day 11 of symptoms.  Sore throat is likely due to a virus.  Please continue taking your antihistamine (Xyzal), this will help with postnasal drainage contributing to sore throat.  You may find the adding a humidifier to your room will help moisten the air and prevent irritation to the throat/nose.  You may take ibuprofen 600 mg every 6 hours as needed for throat pain. You may also use viscous lidocaine every 6 hours as needed for throat pain as well. You may gargle with salt water and drink warm tea with honey to further reduce throat pain.  Take Tessalon Perles every 8 hours as needed for cough should you develop a cough.  If you develop any new or worsening symptoms or do not improve in the next 2 to 3 days, please return.  If your symptoms are severe, please go to the emergency room.  Follow-up with your primary care provider for further evaluation and management of your symptoms as well as ongoing wellness visits.  I hope you feel better!

## 2022-08-11 LAB — SARS CORONAVIRUS 2 (TAT 6-24 HRS): SARS Coronavirus 2: NEGATIVE

## 2022-08-14 ENCOUNTER — Ambulatory Visit (INDEPENDENT_AMBULATORY_CARE_PROVIDER_SITE_OTHER): Payer: 59 | Admitting: *Deleted

## 2022-08-14 DIAGNOSIS — J309 Allergic rhinitis, unspecified: Secondary | ICD-10-CM | POA: Diagnosis not present

## 2022-08-24 ENCOUNTER — Ambulatory Visit (INDEPENDENT_AMBULATORY_CARE_PROVIDER_SITE_OTHER): Payer: 59 | Admitting: *Deleted

## 2022-08-24 DIAGNOSIS — J309 Allergic rhinitis, unspecified: Secondary | ICD-10-CM

## 2022-08-27 DIAGNOSIS — F411 Generalized anxiety disorder: Secondary | ICD-10-CM | POA: Diagnosis not present

## 2022-09-04 ENCOUNTER — Other Ambulatory Visit (HOSPITAL_COMMUNITY): Payer: Self-pay

## 2022-09-04 ENCOUNTER — Ambulatory Visit (INDEPENDENT_AMBULATORY_CARE_PROVIDER_SITE_OTHER): Payer: 59 | Admitting: *Deleted

## 2022-09-04 DIAGNOSIS — J309 Allergic rhinitis, unspecified: Secondary | ICD-10-CM | POA: Diagnosis not present

## 2022-09-10 DIAGNOSIS — F411 Generalized anxiety disorder: Secondary | ICD-10-CM | POA: Diagnosis not present

## 2022-09-11 ENCOUNTER — Ambulatory Visit (INDEPENDENT_AMBULATORY_CARE_PROVIDER_SITE_OTHER): Payer: 59

## 2022-09-11 DIAGNOSIS — J309 Allergic rhinitis, unspecified: Secondary | ICD-10-CM

## 2022-09-17 ENCOUNTER — Ambulatory Visit (INDEPENDENT_AMBULATORY_CARE_PROVIDER_SITE_OTHER): Payer: 59

## 2022-09-17 DIAGNOSIS — J309 Allergic rhinitis, unspecified: Secondary | ICD-10-CM | POA: Diagnosis not present

## 2022-09-23 DIAGNOSIS — F411 Generalized anxiety disorder: Secondary | ICD-10-CM | POA: Diagnosis not present

## 2022-09-28 ENCOUNTER — Ambulatory Visit (INDEPENDENT_AMBULATORY_CARE_PROVIDER_SITE_OTHER): Payer: 59 | Admitting: *Deleted

## 2022-09-28 DIAGNOSIS — J309 Allergic rhinitis, unspecified: Secondary | ICD-10-CM | POA: Diagnosis not present

## 2022-10-09 ENCOUNTER — Ambulatory Visit (INDEPENDENT_AMBULATORY_CARE_PROVIDER_SITE_OTHER): Payer: 59

## 2022-10-09 DIAGNOSIS — J309 Allergic rhinitis, unspecified: Secondary | ICD-10-CM

## 2022-10-14 ENCOUNTER — Ambulatory Visit (INDEPENDENT_AMBULATORY_CARE_PROVIDER_SITE_OTHER): Payer: 59 | Admitting: Family Medicine

## 2022-10-14 ENCOUNTER — Encounter: Payer: Self-pay | Admitting: Family Medicine

## 2022-10-14 VITALS — BP 85/55 | HR 70 | Temp 97.8°F | Resp 18 | Ht 62.5 in | Wt 166.0 lb

## 2022-10-14 DIAGNOSIS — Z5181 Encounter for therapeutic drug level monitoring: Secondary | ICD-10-CM

## 2022-10-14 DIAGNOSIS — Z7689 Persons encountering health services in other specified circumstances: Secondary | ICD-10-CM | POA: Diagnosis not present

## 2022-10-14 DIAGNOSIS — L709 Acne, unspecified: Secondary | ICD-10-CM | POA: Diagnosis not present

## 2022-10-14 DIAGNOSIS — R5382 Chronic fatigue, unspecified: Secondary | ICD-10-CM

## 2022-10-14 NOTE — Progress Notes (Signed)
9

## 2022-10-14 NOTE — Progress Notes (Signed)
New Patient Office Visit  Subjective    Patient ID: Amanda Blackburn, female    DOB: May 11, 2001  Age: 22 y.o. MRN: [id number]  CC:  Chief Complaint  Patient presents with   Establish Care    HPI Amanda Blackburn presents to establish care. Pt is new to me.  Pt reports fatigued for the last 3 weeks. She thought it was due to Vitamin D as this has been low in the past. She thought it was due to work schedule also but she has been severely tired. She has been trying to drink plenty of water and still has dry mouth. She has been on Aldactone  for the last few months for acne. She has been on this for at least since October 2023.    Outpatient Encounter Medications as of 10/14/2022  Medication Sig   albuterol (PROAIR HFA) 108 (90 Base) MCG/ACT inhaler Inhale 2 puffs into the lungs every 4 (four) hours as needed for wheezing or shortness of breath.   beclomethasone (QVAR REDIHALER) 40 MCG/ACT inhaler Inhale 2 puffs into the lungs 2 (two) times daily. Rinse mouth after use.   Benzoyl Peroxide ([employer] TARGETED ACNE SPOT) 2.5 % CREA Apply as needed to active areas   clindamycin (CLEOCIN T) 1 % external solution Apply topically 2 (two) times daily.   clotrimazole (CLOTRIMAZOLE AF) 1 % cream Apply 1 Application topically 2 (two) times daily. as needed weekly 1-2 weeks   desonide (DESOWEN) 0.05 % cream Apply topically 2 (two) times daily. To eyelids and under eyes as needed   ibuprofen (ADVIL) 600 MG tablet Take 1 tablet by mouth every 6 hours as needed.   levocetirizine (XYZAL) 5 MG tablet Take 1 tablet (5 mg total) by mouth every evening.   lidocaine (XYLOCAINE) 2 % solution Use as directed 15 mLs in the mouth or throat every 6 hours as needed for mouth pain.   Lidocaine, Anorectal, 5 % CREA Apply topically 2 times daily as needed.   montelukast (SINGULAIR) 10 MG tablet Take 1 tablet (10 mg total) by mouth at bedtime.   mupirocin ointment (BACTROBAN) 2 % Apply 1 application topically 2 (two) times  daily.   spironolactone (ALDACTONE) 50 MG tablet Take 1 tablet (50 mg total) by mouth daily.   Cholecalciferol 1.25 MG (50000 UT) capsule Take 1 capsule (50,000 Units total) by mouth once a week. d3 (Patient not taking: Reported on 10/14/2022)   fluticasone (FLONASE) 50 MCG/ACT nasal spray PLACE 2 SPRAYS INTO BOTH NOSTRILS DAILY.   [DISCONTINUED] benzonatate (TESSALON) 100 MG capsule Take 1 capsule by mouth every 8 hours as needed   [DISCONTINUED] hydrocortisone (ANUSOL-HC) 2.5 % rectal cream Place 1 application rectally 2 (two) times daily.   [DISCONTINUED] hydrocortisone 2.5 % ointment Apply topically 2 (two) times daily as needed 1-2 weeks   [DISCONTINUED] pimecrolimus (ELIDEL) 1 % cream Apply topically 2 (two) times daily as needed.   [DISCONTINUED] predniSONE (DELTASONE) 20 MG tablet Take 2 tablets (40 mg total) by mouth daily.   [DISCONTINUED] promethazine-dextromethorphan (PROMETHAZINE-DM) 6.25-15 MG/5ML syrup Take 5 mLs by mouth 3 (three) times daily as needed for cough.   [DISCONTINUED] tretinoin (RETIN-A) 0.05 % cream Apply a pea sized amount to face and neck at bedtime.   [DISCONTINUED] triamcinolone cream (KENALOG) 0.1 % Apply 1 application topically 2 (two) times daily.   No facility-administered encounter medications on file as of 10/14/2022.    Past Medical History:  Diagnosis Date   Allergy    Anxiety  Asthma    Depression    sees therapy    Eczema    Frequent headaches    Seasonal allergies     Past Surgical History:  Procedure Laterality Date   NO PAST SURGERIES      Family History  Problem Relation Age of Onset   Asthma Mother    Diabetes Mother    Hypertension Father    Asthma Sister     Social History   Socioeconomic History   Marital status: Single    Spouse name: Not on file   Number of children: Not on file   Years of education: Not on file   Highest education level: Not on file  Occupational History   Not on file  Tobacco Use   Smoking  status: Never   Smokeless tobacco: Never  Vaping Use   Vaping Use: Never used  Substance and Sexual Activity   Alcohol use: No   Drug use: No   Sexual activity: Yes    Birth control/protection: I.U.D.  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  HENT:         Dry mouth  All other systems reviewed and are negative.       Objective    BP (!) 80/50   Pulse 70   Temp 97.8 F (36.6 C) (Oral)   Resp 18   Ht 5' 2.5" (1.588 m)   Wt 166 lb (75.3 kg)   LMP 10/02/2022 (Exact Date)   SpO2 100%   BMI 29.88 kg/m   Physical Exam Vitals and nursing note reviewed.  Constitutional:      Appearance: Normal appearance. She is normal weight.  HENT:     Head: Normocephalic and atraumatic.     Right Ear: External ear normal.     Left Ear: External ear normal.     Nose: Nose normal.     Mouth/Throat:     Mouth: Mucous membranes are moist.  Cardiovascular:     Rate and Rhythm: Normal rate and regular rhythm.     Pulses: Normal pulses.     Heart sounds: Normal heart sounds.  Neurological:     Mental Status: She is alert.         Assessment & Plan:   Problem List Items Addressed This Visit   None  Encounter to establish care with new doctor  Chronic fatigue -     Comprehensive metabolic panel -     Magnesium  Medication monitoring encounter -     Comprehensive metabolic  panel -     Magnesium  Acne, unspecified acne type  Pt has been extremely fatigued for the last 3 weeks. She is noted to be taking Aldactone  daily since October 2023 due  to acne. She says this has worked. She has had baseline blood pressures 100-110 SBP and today, very low. Rechecked and the same running sbp 80-85.  Have advised pt this is likely the cause of her fatigue I.e dehydration. Have advised to stop the Aldactone 50 mg and will be checking CMP and magnesium today.  Pt to return on Friday for recheck blood pressure. Have advised to continue liquid hydration/gatorade until then.  Suspect pt's blood pressure will normalize once she comes off of the Aldactone.  No follow-ups on file.   Suzan Slick, MD

## 2022-10-15 LAB — COMPREHENSIVE METABOLIC PANEL
ALT: 12 IU/L (ref 0–32)
AST: 18 IU/L (ref 0–40)
Albumin/Globulin Ratio: 1.4 (ref 1.2–2.2)
Albumin: 4.2 g/dL (ref 4.0–5.0)
Alkaline Phosphatase: 64 IU/L (ref 44–121)
BUN/Creatinine Ratio: 19 (ref 9–23)
BUN: 16 mg/dL (ref 6–20)
Bilirubin Total: 0.2 mg/dL (ref 0.0–1.2)
CO2: 19 mmol/L — ABNORMAL LOW (ref 20–29)
Calcium: 9.1 mg/dL (ref 8.7–10.2)
Chloride: 105 mmol/L (ref 96–106)
Creatinine, Ser: 0.83 mg/dL (ref 0.57–1.00)
Globulin, Total: 3 g/dL (ref 1.5–4.5)
Glucose: 73 mg/dL (ref 70–99)
Potassium: 4.4 mmol/L (ref 3.5–5.2)
Sodium: 139 mmol/L (ref 134–144)
Total Protein: 7.2 g/dL (ref 6.0–8.5)
eGFR: 102 mL/min/{1.73_m2} (ref >59)

## 2022-10-15 LAB — MAGNESIUM: Magnesium: 2.2 mg/dL (ref 1.6–2.3)

## 2022-10-16 ENCOUNTER — Encounter (HOSPITAL_COMMUNITY): Payer: Self-pay

## 2022-10-16 ENCOUNTER — Ambulatory Visit: Payer: 59

## 2022-10-16 ENCOUNTER — Ambulatory Visit (HOSPITAL_COMMUNITY)
Admission: EM | Admit: 2022-10-16 | Discharge: 2022-10-16 | Disposition: A | Discharge status: Home / Self Care | Payer: 59 | Attending: Internal Medicine | Admitting: Internal Medicine

## 2022-10-16 DIAGNOSIS — I952 Hypotension due to drugs: Secondary | ICD-10-CM

## 2022-10-16 DIAGNOSIS — E860 Dehydration: Secondary | ICD-10-CM

## 2022-10-16 MED ORDER — SODIUM CHLORIDE 0.9 % IV BOLUS
1000.0000 mL | Freq: Once | INTRAVENOUS | Status: AC
  Administered 2022-10-16: 1000 mL via INTRAVENOUS

## 2022-10-16 NOTE — ED Triage Notes (Signed)
Pt states that she took Bp. 80/50 on wed. Today   at 6:25 pm 88/57.  O2 99 p: 65  Second reading 100/65. These were taken here this evening.  Feeling dehydrated Wednesday,dehydrated and dizzy. Pt states that he Dr has her on acne meds. She stopped when her dr advised Wednesday due to her BP being low. She's been drinking electrolytes.

## 2022-10-16 NOTE — Discharge Instructions (Signed)
We gave you IV fluids today. Follow-up with your primary care provider for ongoing evaluation and management of your acne. Continue drinking plenty of fluids at home to stay well-hydrated.    If you develop any new or worsening symptoms or do not improve in the next 2 to 3 days, please return.  If your symptoms are severe, please go to the emergency room.  Follow-up with your primary care provider for further evaluation and management of your symptoms as well as ongoing wellness visits.  I hope you feel better!
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
## 2022-10-19 ENCOUNTER — Ambulatory Visit (INDEPENDENT_AMBULATORY_CARE_PROVIDER_SITE_OTHER): Payer: 59

## 2022-10-19 DIAGNOSIS — J309 Allergic rhinitis, unspecified: Secondary | ICD-10-CM

## 2022-10-30 ENCOUNTER — Ambulatory Visit (INDEPENDENT_AMBULATORY_CARE_PROVIDER_SITE_OTHER): Payer: 59

## 2022-10-30 DIAGNOSIS — J309 Allergic rhinitis, unspecified: Secondary | ICD-10-CM

## 2022-11-06 ENCOUNTER — Ambulatory Visit (INDEPENDENT_AMBULATORY_CARE_PROVIDER_SITE_OTHER): Payer: 59 | Admitting: *Deleted

## 2022-11-06 DIAGNOSIS — J309 Allergic rhinitis, unspecified: Secondary | ICD-10-CM

## 2022-11-12 ENCOUNTER — Ambulatory Visit (INDEPENDENT_AMBULATORY_CARE_PROVIDER_SITE_OTHER): Payer: 59

## 2022-11-12 DIAGNOSIS — J309 Allergic rhinitis, unspecified: Secondary | ICD-10-CM

## 2022-11-19 ENCOUNTER — Encounter: Payer: No Typology Code available for payment source | Admitting: Family Medicine

## 2022-11-19 ENCOUNTER — Ambulatory Visit (INDEPENDENT_AMBULATORY_CARE_PROVIDER_SITE_OTHER): Payer: 59

## 2022-11-19 ENCOUNTER — Other Ambulatory Visit (HOSPITAL_COMMUNITY): Payer: Self-pay

## 2022-11-19 DIAGNOSIS — J309 Allergic rhinitis, unspecified: Secondary | ICD-10-CM | POA: Diagnosis not present

## 2022-11-19 DIAGNOSIS — T781XXD Other adverse food reactions, not elsewhere classified, subsequent encounter: Secondary | ICD-10-CM

## 2022-11-19 MED ORDER — LEVOCETIRIZINE DIHYDROCHLORIDE 5 MG PO TABS
5.0000 mg | ORAL_TABLET | Freq: Every day | ORAL | 1 refills | Status: DC | PRN
  Filled 2022-11-19: qty 180, 90d supply, fill #0

## 2022-11-19 MED ORDER — FLUTICASONE PROPIONATE 50 MCG/ACT NA SUSP
2.0000 | Freq: Every day | NASAL | 1 refills | Status: DC
  Filled 2022-11-19: qty 48, 90d supply, fill #0

## 2022-11-26 ENCOUNTER — Ambulatory Visit (INDEPENDENT_AMBULATORY_CARE_PROVIDER_SITE_OTHER): Payer: 59

## 2022-11-26 DIAGNOSIS — J309 Allergic rhinitis, unspecified: Secondary | ICD-10-CM | POA: Diagnosis not present

## 2022-11-27 ENCOUNTER — Other Ambulatory Visit (HOSPITAL_COMMUNITY): Payer: Self-pay

## 2022-12-04 ENCOUNTER — Ambulatory Visit (INDEPENDENT_AMBULATORY_CARE_PROVIDER_SITE_OTHER): Payer: 59 | Admitting: *Deleted

## 2022-12-04 DIAGNOSIS — J309 Allergic rhinitis, unspecified: Secondary | ICD-10-CM | POA: Diagnosis not present

## 2022-12-09 ENCOUNTER — Other Ambulatory Visit (HOSPITAL_COMMUNITY): Payer: Self-pay

## 2022-12-16 ENCOUNTER — Other Ambulatory Visit (HOSPITAL_COMMUNITY): Payer: Self-pay

## 2022-12-17 ENCOUNTER — Ambulatory Visit (INDEPENDENT_AMBULATORY_CARE_PROVIDER_SITE_OTHER): Payer: 59

## 2022-12-17 ENCOUNTER — Other Ambulatory Visit (HOSPITAL_COMMUNITY): Payer: Self-pay

## 2022-12-17 DIAGNOSIS — J309 Allergic rhinitis, unspecified: Secondary | ICD-10-CM

## 2022-12-28 ENCOUNTER — Ambulatory Visit (INDEPENDENT_AMBULATORY_CARE_PROVIDER_SITE_OTHER): Payer: 59

## 2022-12-28 DIAGNOSIS — J309 Allergic rhinitis, unspecified: Secondary | ICD-10-CM

## 2022-12-29 ENCOUNTER — Other Ambulatory Visit (HOSPITAL_COMMUNITY): Payer: Self-pay

## 2022-12-30 ENCOUNTER — Other Ambulatory Visit (HOSPITAL_COMMUNITY): Payer: Self-pay

## 2023-01-07 ENCOUNTER — Other Ambulatory Visit (HOSPITAL_COMMUNITY): Payer: Self-pay

## 2023-01-07 DIAGNOSIS — F411 Generalized anxiety disorder: Secondary | ICD-10-CM | POA: Diagnosis not present

## 2023-01-08 ENCOUNTER — Other Ambulatory Visit (HOSPITAL_COMMUNITY): Payer: Self-pay

## 2023-01-14 DIAGNOSIS — F411 Generalized anxiety disorder: Secondary | ICD-10-CM | POA: Diagnosis not present

## 2023-01-15 ENCOUNTER — Ambulatory Visit: Payer: 59 | Admitting: Internal Medicine

## 2023-01-15 DIAGNOSIS — J309 Allergic rhinitis, unspecified: Secondary | ICD-10-CM

## 2023-01-19 ENCOUNTER — Ambulatory Visit: Payer: 59 | Admitting: Obstetrics and Gynecology

## 2023-01-20 ENCOUNTER — Ambulatory Visit (INDEPENDENT_AMBULATORY_CARE_PROVIDER_SITE_OTHER): Payer: 59 | Admitting: *Deleted

## 2023-01-20 DIAGNOSIS — J309 Allergic rhinitis, unspecified: Secondary | ICD-10-CM

## 2023-01-21 ENCOUNTER — Encounter: Payer: 59 | Admitting: Family Medicine

## 2023-01-21 DIAGNOSIS — J3089 Other allergic rhinitis: Secondary | ICD-10-CM | POA: Diagnosis not present

## 2023-01-21 NOTE — Progress Notes (Signed)
VIALS EXP 01-21-24

## 2023-01-24 ENCOUNTER — Encounter (HOSPITAL_COMMUNITY): Payer: Self-pay

## 2023-01-24 ENCOUNTER — Ambulatory Visit (HOSPITAL_COMMUNITY)
Admission: EM | Admit: 2023-01-24 | Discharge: 2023-01-24 | Disposition: A | Discharge status: Home / Self Care | Payer: 59 | Attending: Emergency Medicine | Admitting: Emergency Medicine

## 2023-01-24 DIAGNOSIS — N898 Other specified noninflammatory disorders of vagina: Secondary | ICD-10-CM

## 2023-01-24 DIAGNOSIS — Z113 Encounter for screening for infections with a predominantly sexual mode of transmission: Secondary | ICD-10-CM

## 2023-01-24 LAB — POCT URINALYSIS DIP (MANUAL ENTRY)
Bilirubin, UA: NEGATIVE
Glucose, UA: NEGATIVE mg/dL
Leukocytes, UA: NEGATIVE
Nitrite, UA: NEGATIVE
Protein Ur, POC: NEGATIVE mg/dL
Spec Grav, UA: 1.025 (ref 1.010–1.025)
Urobilinogen, UA: 0.2 E.U./dL
pH, UA: 5.5 (ref 5.0–8.0)

## 2023-01-24 NOTE — Discharge Instructions (Addendum)
We will call you if anything on your vaginal swab returns positive. You can also see these results on MyChart. It may take 1-2 days to result.   I am also sending your urine off for culture. It can take 2-3 days to result. If anything grows, we will call you and treat you with antibiotics  In the meantime, drink lots of water! At least 64 oz daily
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
## 2023-01-24 NOTE — ED Triage Notes (Signed)
Patient c/o yeast infection x 1 1/2 days ago.   Patient denies using any OTC medication for her symptoms.

## 2023-01-25 ENCOUNTER — Telehealth: Payer: Self-pay

## 2023-01-25 ENCOUNTER — Other Ambulatory Visit (HOSPITAL_COMMUNITY): Payer: Self-pay

## 2023-01-25 MED ORDER — FLUCONAZOLE 150 MG PO TABS
150.0000 mg | ORAL_TABLET | Freq: Once | ORAL | 0 refills | Status: AC
  Filled 2023-01-25: qty 2, 2d supply, fill #0

## 2023-01-25 NOTE — Telephone Encounter (Signed)
Per protocol pt requires tx with Diflucan. Reviewed with patient, verified pharmacy, prescription sent

## 2023-01-26 ENCOUNTER — Other Ambulatory Visit (HOSPITAL_COMMUNITY): Payer: Self-pay

## 2023-01-27 ENCOUNTER — Ambulatory Visit: Payer: 59 | Admitting: Family Medicine

## 2023-02-12 ENCOUNTER — Ambulatory Visit: Payer: Self-pay | Admitting: Internal Medicine

## 2023-02-12 DIAGNOSIS — J309 Allergic rhinitis, unspecified: Secondary | ICD-10-CM

## 2023-03-04 ENCOUNTER — Ambulatory Visit
Admission: RE | Admit: 2023-03-04 | Discharge: 2023-03-04 | Disposition: A | Discharge status: Home / Self Care | Payer: 59 | Source: Ambulatory Visit | Attending: Family Medicine | Admitting: Family Medicine

## 2023-03-04 ENCOUNTER — Other Ambulatory Visit (HOSPITAL_COMMUNITY): Payer: Self-pay

## 2023-03-04 VITALS — BP 117/71 | HR 73 | Temp 98.5°F | Resp 16

## 2023-03-04 DIAGNOSIS — H66001 Acute suppurative otitis media without spontaneous rupture of ear drum, right ear: Secondary | ICD-10-CM

## 2023-03-04 DIAGNOSIS — J0100 Acute maxillary sinusitis, unspecified: Secondary | ICD-10-CM

## 2023-03-04 MED ORDER — AMOXICILLIN-POT CLAVULANATE 875-125 MG PO TABS
1.0000 | ORAL_TABLET | Freq: Two times a day (BID) | ORAL | 0 refills | Status: DC
  Filled 2023-03-04: qty 14, 7d supply, fill #0

## 2023-03-04 MED ORDER — PREDNISONE 20 MG PO TABS
20.0000 mg | ORAL_TABLET | Freq: Two times a day (BID) | ORAL | 0 refills | Status: DC
  Filled 2023-03-04: qty 10, 5d supply, fill #0
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
## 2023-03-04 NOTE — ED Triage Notes (Signed)
Pt reports having nasal congestion and sinus pressure on left side for several days. Took some medications for it over the weekend but still persisting.

## 2023-03-04 NOTE — Discharge Instructions (Signed)
Take the antibiotic as directed 2 times a day.  Consider getting a probiotic to take with this antibiotic Take the prednisone as directed.  This will help reduce the nasal congestion and swelling Drink lots of water. Take Mucinex to loosen the sinus drainage Call for problems

## 2023-03-08 ENCOUNTER — Other Ambulatory Visit: Payer: Self-pay

## 2023-03-08 ENCOUNTER — Other Ambulatory Visit (HOSPITAL_COMMUNITY): Payer: Self-pay

## 2023-03-09 ENCOUNTER — Ambulatory Visit: Payer: 59 | Admitting: Obstetrics and Gynecology

## 2023-03-23 ENCOUNTER — Ambulatory Visit: Payer: 59 | Admitting: Family Medicine

## 2023-03-24 ENCOUNTER — Encounter: Payer: Self-pay | Admitting: Family Medicine

## 2023-03-24 ENCOUNTER — Ambulatory Visit: Payer: 59 | Admitting: Family Medicine

## 2023-03-30 ENCOUNTER — Ambulatory Visit
Admission: RE | Admit: 2023-03-30 | Discharge: 2023-03-30 | Disposition: A | Discharge status: Home / Self Care | Payer: 59 | Source: Ambulatory Visit | Attending: Emergency Medicine | Admitting: Emergency Medicine

## 2023-03-30 ENCOUNTER — Other Ambulatory Visit (HOSPITAL_COMMUNITY): Payer: Self-pay

## 2023-03-30 VITALS — BP 100/65 | HR 96 | Temp 98.6°F | Resp 18

## 2023-03-30 DIAGNOSIS — J0101 Acute recurrent maxillary sinusitis: Secondary | ICD-10-CM | POA: Diagnosis not present

## 2023-03-30 MED ORDER — CEFDINIR 300 MG PO CAPS
300.0000 mg | ORAL_CAPSULE | Freq: Two times a day (BID) | ORAL | 0 refills | Status: DC
  Filled 2023-03-30: qty 20, 10d supply, fill #0

## 2023-03-30 NOTE — Discharge Instructions (Addendum)
Take the cefdinir as directed.  Follow up with your primary care provider.    
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
## 2023-05-03 ENCOUNTER — Other Ambulatory Visit (HOSPITAL_COMMUNITY)
Admission: RE | Admit: 2023-05-03 | Discharge: 2023-05-03 | Disposition: A | Discharge status: Home / Self Care | Payer: 59 | Source: Ambulatory Visit | Attending: Obstetrics and Gynecology | Admitting: Obstetrics and Gynecology

## 2023-05-03 ENCOUNTER — Encounter: Payer: Self-pay | Admitting: Obstetrics and Gynecology

## 2023-05-03 ENCOUNTER — Ambulatory Visit: Payer: 59 | Admitting: Obstetrics and Gynecology

## 2023-05-03 ENCOUNTER — Other Ambulatory Visit (HOSPITAL_COMMUNITY): Payer: Self-pay

## 2023-05-03 VITALS — BP 91/59 | HR 80 | Ht 62.0 in | Wt 155.0 lb

## 2023-05-03 DIAGNOSIS — Z3202 Encounter for pregnancy test, result negative: Secondary | ICD-10-CM

## 2023-05-03 DIAGNOSIS — R87810 Cervical high risk human papillomavirus (HPV) DNA test positive: Secondary | ICD-10-CM | POA: Insufficient documentation

## 2023-05-03 DIAGNOSIS — Z01419 Encounter for gynecological examination (general) (routine) without abnormal findings: Secondary | ICD-10-CM | POA: Diagnosis not present

## 2023-05-03 DIAGNOSIS — Z1151 Encounter for screening for human papillomavirus (HPV): Secondary | ICD-10-CM | POA: Insufficient documentation

## 2023-05-03 LAB — POCT URINE PREGNANCY: Preg Test, Ur: NEGATIVE

## 2023-05-03 MED ORDER — PHEXXI 1.8-1-0.4 % VA GEL
VAGINAL | 12 refills | Status: DC
  Filled 2023-05-03: qty 5, 30d supply, fill #0

## 2023-05-03 NOTE — Addendum Note (Signed)
Addended by: Salome Holmes on: 05/03/2023 04:25 PM   Modules accepted: Orders
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
## 2023-05-04 LAB — HEPATITIS B SURFACE ANTIGEN: Hepatitis B Surface Ag: NEGATIVE

## 2023-05-04 LAB — CERVICOVAGINAL ANCILLARY ONLY
Chlamydia: NEGATIVE
Comment: NEGATIVE
Comment: NORMAL
Neisseria Gonorrhea: NEGATIVE

## 2023-05-04 LAB — HIV ANTIBODY (ROUTINE TESTING W REFLEX): HIV Screen 4th Generation wRfx: NONREACTIVE

## 2023-05-04 LAB — RPR: RPR Ser Ql: NONREACTIVE

## 2023-05-04 LAB — HEPATITIS C ANTIBODY: Hep C Virus Ab: NONREACTIVE

## 2023-05-11 LAB — CYTOLOGY - PAP
Comment: NEGATIVE
Diagnosis: NEGATIVE
High risk HPV: POSITIVE — AB

## 2023-05-14 ENCOUNTER — Other Ambulatory Visit (HOSPITAL_COMMUNITY): Payer: Self-pay

## 2023-05-25 ENCOUNTER — Ambulatory Visit: Payer: 59 | Admitting: Family Medicine

## 2023-07-08 ENCOUNTER — Other Ambulatory Visit (HOSPITAL_COMMUNITY): Payer: Self-pay

## 2023-07-08 DIAGNOSIS — L700 Acne vulgaris: Secondary | ICD-10-CM | POA: Diagnosis not present

## 2023-07-08 MED ORDER — AZELAIC ACID 15 % EX GEL
CUTANEOUS | 3 refills | Status: AC
  Filled 2023-07-08: qty 50, 30d supply, fill #0

## 2023-07-08 MED ORDER — SPIRONOLACTONE 50 MG PO TABS
50.0000 mg | ORAL_TABLET | Freq: Every day | ORAL | 3 refills | Status: AC
  Filled 2023-07-08: qty 90, 90d supply, fill #0

## 2023-07-09 ENCOUNTER — Encounter: Payer: Commercial Managed Care - PPO | Admitting: Family Medicine

## 2023-07-09 ENCOUNTER — Telehealth: Payer: Self-pay | Admitting: Family Medicine

## 2023-07-09 ENCOUNTER — Other Ambulatory Visit (HOSPITAL_COMMUNITY): Payer: Self-pay

## 2023-08-26 ENCOUNTER — Other Ambulatory Visit (HOSPITAL_COMMUNITY): Payer: Self-pay

## 2023-08-26 ENCOUNTER — Other Ambulatory Visit: Payer: Self-pay

## 2023-08-26 DIAGNOSIS — L700 Acne vulgaris: Secondary | ICD-10-CM | POA: Diagnosis not present

## 2023-08-26 MED ORDER — SPIRONOLACTONE 100 MG PO TABS
100.0000 mg | ORAL_TABLET | Freq: Every day | ORAL | 0 refills | Status: DC
  Filled 2023-08-26 – 2023-09-16 (×2): qty 90, 90d supply, fill #0

## 2023-09-06 ENCOUNTER — Other Ambulatory Visit (HOSPITAL_COMMUNITY): Payer: Self-pay

## 2023-09-09 ENCOUNTER — Encounter: Payer: Commercial Managed Care - PPO | Admitting: Family Medicine

## 2023-09-16 ENCOUNTER — Other Ambulatory Visit (HOSPITAL_COMMUNITY): Payer: Self-pay

## 2023-09-17 ENCOUNTER — Other Ambulatory Visit: Payer: Self-pay

## 2023-09-17 ENCOUNTER — Other Ambulatory Visit (HOSPITAL_COMMUNITY): Payer: Self-pay

## 2023-09-17 ENCOUNTER — Ambulatory Visit (INDEPENDENT_AMBULATORY_CARE_PROVIDER_SITE_OTHER): Admitting: Internal Medicine

## 2023-09-17 ENCOUNTER — Encounter: Payer: Self-pay | Admitting: Internal Medicine

## 2023-09-17 VITALS — BP 90/60 | HR 72 | Temp 98.1°F | Ht 63.39 in | Wt 161.0 lb

## 2023-09-17 DIAGNOSIS — J4520 Mild intermittent asthma, uncomplicated: Secondary | ICD-10-CM | POA: Diagnosis not present

## 2023-09-17 DIAGNOSIS — J302 Other seasonal allergic rhinitis: Secondary | ICD-10-CM

## 2023-09-17 DIAGNOSIS — T781XXD Other adverse food reactions, not elsewhere classified, subsequent encounter: Secondary | ICD-10-CM | POA: Diagnosis not present

## 2023-09-17 DIAGNOSIS — L2389 Allergic contact dermatitis due to other agents: Secondary | ICD-10-CM

## 2023-09-17 DIAGNOSIS — J3089 Other allergic rhinitis: Secondary | ICD-10-CM

## 2023-09-17 MED ORDER — ALBUTEROL SULFATE HFA 108 (90 BASE) MCG/ACT IN AERS
2.0000 | INHALATION_SPRAY | RESPIRATORY_TRACT | 1 refills | Status: DC | PRN
  Filled 2023-09-17 (×2): qty 6.7, 17d supply, fill #0

## 2023-09-17 MED ORDER — TRIAMCINOLONE ACETONIDE 0.1 % EX OINT
TOPICAL_OINTMENT | CUTANEOUS | 5 refills | Status: AC
  Filled 2023-09-17: qty 80, 30d supply, fill #0

## 2023-09-17 MED ORDER — PIMECROLIMUS 1 % EX CREA
TOPICAL_CREAM | Freq: Two times a day (BID) | CUTANEOUS | 5 refills | Status: AC
  Filled 2023-09-17: qty 30, 30d supply, fill #0

## 2023-09-17 MED ORDER — CETIRIZINE HCL 10 MG PO TABS
10.0000 mg | ORAL_TABLET | Freq: Every day | ORAL | 1 refills | Status: DC
  Filled 2023-09-17: qty 90, 90d supply, fill #0

## 2023-09-17 MED ORDER — HYDROCORTISONE 2.5 % EX CREA
TOPICAL_CREAM | CUTANEOUS | 5 refills | Status: AC
  Filled 2023-09-17: qty 30, 15d supply, fill #0

## 2023-09-17 MED ORDER — FLUTICASONE PROPIONATE 50 MCG/ACT NA SUSP
2.0000 | Freq: Every day | NASAL | 5 refills | Status: AC
  Filled 2023-09-17: qty 16, 30d supply, fill #0

## 2023-09-17 MED ORDER — MONTELUKAST SODIUM 10 MG PO TABS
10.0000 mg | ORAL_TABLET | Freq: Every day | ORAL | 1 refills | Status: AC
  Filled 2023-09-17: qty 90, 90d supply, fill #0

## 2023-09-17 NOTE — Progress Notes (Signed)
 FOLLOW UP Date of Service/Encounter:  09/17/23   Subjective:  Amanda Blackburn (DOB: 2001/03/09) is a 23 y.o. female who [facility] for follow up for asthma, allergic rhinitis, PFAS and dermatitis.   History obtained from: chart review and patient. Last visit was with Dr Marlynn Perking on 08/07/2022 with plans for extended patch testing when available, asthma controlled on Singulair, AR on Flonase/Xyzal/Singulair/AIT, dermatitis on hydrocortisone/Elidel.  Reports still having on and off rashes.  More recently has noted dry itchy rash to R neck.  Using La Rochey Posey cream to moisturize but has been out of medicated creams.  Not on topical steroids or Elidel.    Asthma has done well, not much trouble with wheeze/dyspnea.  Rarely uses Albuterol.  No ER or urgent care visits or oral prednisone use since last visit.  Allergies are starting cause trouble with congestion, drainage, runny nose. Taking Singulair but has run out of her other medications.  Also has stopped allergy shots because she had to temporarily move to [place] after finishing school but is now back in [place] and wants to restart.  Has an Epipen.    Past Medical History: Past Medical History:  Diagnosis Date   Allergy    Anxiety    Asthma    Depression    sees therapy    Eczema    Frequent headaches    Seasonal allergies     Objective:  BP 90/60 (BP Location: Right Arm, Patient Position: Sitting, Cuff Size: Normal)   Pulse 72   Temp 98.1 F (36.7 C) (Temporal)   Ht 5' 3.39" (1.61 m)   Wt 161 lb (73 kg)   SpO2 98%   BMI 28.17 kg/m  Body mass index is 28.17 kg/m. Physical Exam: GEN: alert, well developed HEENT: clear conjunctiva, nose with moderate inferior turbinate hypertrophy, pink nasal mucosa, +clear rhinorrhea, + cobblestoning HEART: regular rate and rhythm, no murmur LUNGS: clear to auscultation bilaterally, no coughing, unlabored respiration SKIN: dry patch with  excoriations on R neck and lower cheek  Spirometry:  Tracings reviewed. Her effort: Good reproducible efforts. FVC: 3.45L, 109% predicted  FEV1: 2.71L, 98% predicted FEV1/FVC ratio: 79% Interpretation: Spirometry consistent with normal pattern.  Please see scanned spirometry results for details.  Assessment:   1. Seasonal and perennial allergic rhinitis   2. Allergic contact dermatitis due to other agents   3. Pollen-food allergy, subsequent encounter   4. Mild intermittent asthma without complication     Plan/Recommendations:  Dermatitis  - Uncontrolled, will restart topical steroids and Elidel and plan for extended patch test.  - 04/2022: TRUE test negative. - Use a gentle, unscented cleanser at the end of the bath (such as Dove unscented bar or baby wash, or Aveeno sensitive body wash). Then rinse, pat half-way dry, and apply a gentle, unscented moisturizer cream or ointment (Cerave, Cetaphil, Eucerin, Aveeno, Aquaphor, Vanicream, Vaseline)  all over while still damp. Dry skin makes the itching and rash worse. The skin should be moisturized with a gentle, unscented moisturizer at least twice daily.  - Use only unscented liquid laundry detergent. - Apply prescribed topical steroid (triamcinolone 0.1% below neck or hydrocortisone 2.5% above neck) to flared areas (red and thickened eczema) after the moisturizer has soaked into the skin (wait at least 30 minutes). Taper off the topical steroids as the skin improves. Do not use topical steroid for more than 7-10 days at a time.  - Put  Elidel onto areas of  rough eczema twice a day. May decrease to once a day as the eczema improves. This will not thin the skin, and is safe for chronic use. Do not put this onto normal appearing skin. - Will do extended patch testing.  In about 1 month, please call back to see if this is available so you can get scheduled for it.    Mild intermittent asthma  - MDI technique discussed.  Spirometry today is  normal. Controlled  - Maintenance inhaler: continue Singulair 10mg  daily  - Rescue inhaler: Albuterol 2 puffs every 4-6 hours as needed for respiratory symptoms of shortness of breath, or wheezing Asthma control goals:  Full participation in all desired activities (may need albuterol before activity) Albuterol use two times or less a week on average (not counting use with activity) Cough interfering with sleep two times or less a month Oral steroids no more than once a year No hospitalizations   Seasonal and perennial allergic rhinitis  - Uncontrolled, restart allergy medications and allergy shots.  Okay to come twice a week to build up.  - SPT 03/2019: positive to grasses, weeds, trees, ragweed, mold, dust mite, cat - Continue  Flonase 2 sprays per nostril daily.  Aim upward and outward.   - Continue with Zyrtec 10mg  daily. - Continue with Singulair 10mg  daily.  - Restart allergy injections.  Bring Epipen   Pollen-food allergy - These symptoms are typically not life-threatening and are because of a cross reaction between a pollen you are allergic to, and to a protein in specific foods (such as fresh fruits, vegetables, and nuts). - If you can eat these things and tolerate the symptoms, it is fine to continue to do so.  If not, you may avoid these fresh fruits and vegetables.   - Heating these foods, buying them canned, and peeling these foods should allow them to be consumed without symptoms or with less symptoms. - Patients typically report itching and/or mild swelling of the mouth and throat immediately following ingestion of certain uncooked fruits (including nuts) or raw vegetables.  - Only a very small number of affected individuals experience systemic allergic reactions, such as anaphylaxis which occurs with true food allergies.   - for SKIN only reaction, okay to take Benadryl 25mg  capsules every 6 hours as needed - for SKIN + ANY additional symptoms, OR IF concern for LIFE  THREATENING reaction = Epipen Autoinjector EpiPen 0.3 mg. - If using Epinephrine autoinjector, call 911 or go to the ER.       Return in about 3 months (around 12/18/2023).  Alesia Morin, MD [facility]

## 2023-09-17 NOTE — Patient Instructions (Addendum)
 Dermatitis  - Use a gentle, unscented cleanser at the end of the bath (such as Dove unscented bar or baby wash, or Aveeno sensitive body wash). Then rinse, pat half-way dry, and apply a gentle, unscented moisturizer cream or ointment (Cerave, Cetaphil, Eucerin, Aveeno, Aquaphor, Vanicream, Vaseline)  all over while still damp. Dry skin makes the itching and rash worse. The skin should be moisturized with a gentle, unscented moisturizer at least twice daily.  - Use only unscented liquid laundry detergent. - Apply prescribed topical steroid (triamcinolone 0.1% below neck or hydrocortisone 2.5% above neck) to flared areas (red and thickened eczema) after the moisturizer has soaked into the skin (wait at least 30 minutes). Taper off the topical steroids as the skin improves. Do not use topical steroid for more than 7-10 days at a time.  - Put  Elidel 1% onto areas of rough eczema twice a day. May decrease to once a day as the eczema improves. This will not thin the skin, and is safe for chronic use. Do not put this onto normal appearing skin. - Will do extended patch testing.  In about 1 month, please call back to see if this is available so you can get scheduled for it.    Mild intermittent asthma  - Maintenance inhaler: continue Singulair 10mg  daily  - Rescue inhaler: Albuterol 2 puffs every 4-6 hours as needed for respiratory symptoms of shortness of breath, or wheezing Asthma control goals:  Full participation in all desired activities (may need albuterol before activity) Albuterol use two times or less a week on average (not counting use with activity) Cough interfering with sleep two times or less a month Oral steroids no more than once a year No hospitalizations   Seasonal and perennial allergic rhinitis (grasses, weeds, trees, ragweed, mold, dust mite, cat)  - Restart Flonase 2 sprays per nostril daily.  Aim upward and outward.   - Restart Zyrtec 10mg  daily. - Continue Singulair 10mg  daily.   - Restart allergy injections.  Bring Epipen   Pollen-food allergy - These symptoms are typically not life-threatening and are because of a cross reaction between a pollen you are allergic to, and to a protein in specific foods (such as fresh fruits, vegetables, and nuts). - If you can eat these things and tolerate the symptoms, it is fine to continue to do so.  If not, you may avoid these fresh fruits and vegetables.   - Heating these foods, buying them canned, and peeling these foods should allow them to be consumed without symptoms or with less symptoms. - Patients typically report itching and/or mild swelling of the mouth and throat immediately following ingestion of certain uncooked fruits (including nuts) or raw vegetables.  - Only a very small number of affected individuals experience systemic allergic reactions, such as anaphylaxis which occurs with true food allergies.   - for SKIN only reaction, okay to take Benadryl 25mg  capsules every 6 hours as needed - for SKIN + ANY additional symptoms, OR IF concern for LIFE THREATENING reaction = Epipen Autoinjector EpiPen 0.3 mg. - If using Epinephrine autoinjector, call 911 or go to the ER.

## 2023-09-29 ENCOUNTER — Other Ambulatory Visit (HOSPITAL_COMMUNITY): Payer: Self-pay

## 2023-10-07 ENCOUNTER — Ambulatory Visit

## 2023-10-07 ENCOUNTER — Ambulatory Visit (INDEPENDENT_AMBULATORY_CARE_PROVIDER_SITE_OTHER): Payer: Self-pay

## 2023-10-07 DIAGNOSIS — J309 Allergic rhinitis, unspecified: Secondary | ICD-10-CM | POA: Diagnosis not present

## 2023-10-07 NOTE — Progress Notes (Signed)
 Immunotherapy   Patient Details  Name: Amanda Blackburn MRN: [id number] Date of Birth: [birth year]  10/07/2023  Amanda Blackburn started injections for  Mold & Mite, Pollens & Cats. Patient received 0.05 out of both blue vials with an expiration 01/21/24. Following schedule: B  Frequency:1 time per week Epi-Pen:Epi-Pen Available  Consent signed and patient instructions given.   Glena Norfolk 10/07/2023, 3:21 PM

## 2023-10-07 NOTE — Progress Notes (Signed)
 Erroneous encounter - disregard (patient information is in another encounter from today)

## 2023-10-14 ENCOUNTER — Ambulatory Visit (INDEPENDENT_AMBULATORY_CARE_PROVIDER_SITE_OTHER): Payer: Self-pay

## 2023-10-14 DIAGNOSIS — J309 Allergic rhinitis, unspecified: Secondary | ICD-10-CM | POA: Diagnosis not present

## 2023-10-21 ENCOUNTER — Ambulatory Visit (INDEPENDENT_AMBULATORY_CARE_PROVIDER_SITE_OTHER): Payer: Self-pay

## 2023-10-21 DIAGNOSIS — J309 Allergic rhinitis, unspecified: Secondary | ICD-10-CM

## 2023-10-22 ENCOUNTER — Ambulatory Visit

## 2023-10-23 ENCOUNTER — Ambulatory Visit

## 2023-10-26 ENCOUNTER — Ambulatory Visit (INDEPENDENT_AMBULATORY_CARE_PROVIDER_SITE_OTHER): Payer: Self-pay

## 2023-10-26 DIAGNOSIS — J309 Allergic rhinitis, unspecified: Secondary | ICD-10-CM | POA: Diagnosis not present

## 2023-11-05 ENCOUNTER — Ambulatory Visit (INDEPENDENT_AMBULATORY_CARE_PROVIDER_SITE_OTHER): Payer: Self-pay

## 2023-11-05 DIAGNOSIS — J309 Allergic rhinitis, unspecified: Secondary | ICD-10-CM

## 2023-11-11 ENCOUNTER — Ambulatory Visit (INDEPENDENT_AMBULATORY_CARE_PROVIDER_SITE_OTHER): Payer: Self-pay

## 2023-11-11 DIAGNOSIS — J309 Allergic rhinitis, unspecified: Secondary | ICD-10-CM

## 2023-11-19 ENCOUNTER — Ambulatory Visit (INDEPENDENT_AMBULATORY_CARE_PROVIDER_SITE_OTHER)

## 2023-11-19 DIAGNOSIS — J309 Allergic rhinitis, unspecified: Secondary | ICD-10-CM | POA: Diagnosis not present

## 2023-11-25 DIAGNOSIS — L700 Acne vulgaris: Secondary | ICD-10-CM | POA: Diagnosis not present

## 2023-11-26 ENCOUNTER — Ambulatory Visit (INDEPENDENT_AMBULATORY_CARE_PROVIDER_SITE_OTHER): Payer: Self-pay

## 2023-11-26 DIAGNOSIS — J309 Allergic rhinitis, unspecified: Secondary | ICD-10-CM

## 2023-11-29 ENCOUNTER — Other Ambulatory Visit (HOSPITAL_COMMUNITY): Payer: Self-pay

## 2023-11-29 ENCOUNTER — Other Ambulatory Visit: Payer: Self-pay

## 2023-11-29 ENCOUNTER — Other Ambulatory Visit (HOSPITAL_BASED_OUTPATIENT_CLINIC_OR_DEPARTMENT_OTHER): Payer: Self-pay

## 2023-11-29 MED ORDER — SPIRONOLACTONE 100 MG PO TABS
100.0000 mg | ORAL_TABLET | Freq: Every day | ORAL | 1 refills | Status: AC
  Filled 2023-11-29 – 2024-04-12 (×2): qty 90, 90d supply, fill #0

## 2023-11-29 MED ORDER — AZELAIC ACID 15 % EX GEL
CUTANEOUS | 3 refills | Status: AC
  Filled 2023-11-29 – 2024-06-26 (×2): qty 50, 30d supply, fill #0

## 2023-12-03 ENCOUNTER — Ambulatory Visit (INDEPENDENT_AMBULATORY_CARE_PROVIDER_SITE_OTHER): Payer: Self-pay

## 2023-12-03 DIAGNOSIS — J309 Allergic rhinitis, unspecified: Secondary | ICD-10-CM | POA: Diagnosis not present

## 2023-12-08 ENCOUNTER — Ambulatory Visit: Admitting: Family Medicine

## 2023-12-08 ENCOUNTER — Other Ambulatory Visit (HOSPITAL_COMMUNITY): Payer: Self-pay

## 2023-12-14 ENCOUNTER — Ambulatory Visit (INDEPENDENT_AMBULATORY_CARE_PROVIDER_SITE_OTHER)

## 2023-12-14 DIAGNOSIS — J309 Allergic rhinitis, unspecified: Secondary | ICD-10-CM

## 2023-12-17 ENCOUNTER — Ambulatory Visit: Admitting: Internal Medicine

## 2023-12-17 ENCOUNTER — Other Ambulatory Visit: Payer: Self-pay

## 2023-12-17 VITALS — BP 100/70 | HR 70 | Temp 97.9°F | Resp 18 | Ht 62.0 in | Wt 161.0 lb

## 2023-12-17 DIAGNOSIS — J3089 Other allergic rhinitis: Secondary | ICD-10-CM | POA: Diagnosis not present

## 2023-12-17 DIAGNOSIS — H1045 Other chronic allergic conjunctivitis: Secondary | ICD-10-CM | POA: Diagnosis not present

## 2023-12-17 DIAGNOSIS — T781XXD Other adverse food reactions, not elsewhere classified, subsequent encounter: Secondary | ICD-10-CM | POA: Diagnosis not present

## 2023-12-17 DIAGNOSIS — J4520 Mild intermittent asthma, uncomplicated: Secondary | ICD-10-CM | POA: Diagnosis not present

## 2023-12-17 DIAGNOSIS — L2389 Allergic contact dermatitis due to other agents: Secondary | ICD-10-CM

## 2023-12-17 DIAGNOSIS — J302 Other seasonal allergic rhinitis: Secondary | ICD-10-CM | POA: Diagnosis not present

## 2023-12-17 NOTE — Patient Instructions (Addendum)
 Dermatitis,  - Use a gentle, unscented cleanser at the end of the bath (such as Dove unscented bar or baby wash, or Aveeno sensitive body wash). Then rinse, pat half-way dry, and apply a gentle, unscented moisturizer cream or ointment (Cerave, Cetaphil, Eucerin, Aveeno, Aquaphor, Vanicream, Vaseline)  all over while still damp. Dry skin makes the itching and rash worse. The skin should be moisturized with a gentle, unscented moisturizer at least twice daily.  - Use only unscented liquid laundry detergent. - Apply prescribed topical steroid (triamcinolone  0.1% below neck or hydrocortisone  2.5% above neck) to flared areas (red and thickened eczema) after the moisturizer has soaked into the skin (wait at least 30 minutes). Taper off the topical steroids as the skin improves. Do not use topical steroid for more than 7-10 days at a time.  - Put  Elidel  1% onto areas of rough eczema twice a day. May decrease to once a day as the eczema improves. This will not thin the skin, and is safe for chronic use. Do not put this onto normal appearing skin. - Schedule patch testing with NAC 80.    Mild intermittent asthma  Breathing test: Looked great - Maintenance inhaler: continue Singulair  10mg  daily  - Rescue inhaler: Albuterol  2 puffs every 4-6 hours as needed for respiratory symptoms of shortness of breath, or wheezing Asthma control goals:  Full participation in all desired activities (may need albuterol  before activity) Albuterol  use two times or less a week on average (not counting use with activity) Cough interfering with sleep two times or less a month Oral steroids no more than once a year No hospitalizations   Seasonal and perennial allergic rhinitis (grasses, weeds, trees, ragweed, mold, dust mite, cat)  - Continue  Flonase  2 sprays per nostril daily.  Aim upward and outward.   - Continue  Zyrtec  10mg  daily. - Continue Singulair  10mg  daily.  - Continue  allergy  injections.  Bring Epipen     Pollen-food allergy , this should improve once you get on Maintenance for allergy  injections  - These symptoms are typically not life-threatening and are because of a cross reaction between a pollen you are allergic to, and to a protein in specific foods (such as fresh fruits, vegetables, and nuts). - If you can eat these things and tolerate the symptoms, it is fine to continue to do so.  If not, you may avoid these fresh fruits and vegetables.   - Heating these foods, buying them canned, and peeling these foods should allow them to be consumed without symptoms or with less symptoms. - Patients typically report itching and/or mild swelling of the mouth and throat immediately following ingestion of certain uncooked fruits (including nuts) or raw vegetables.  - Only a very small number of affected individuals experience systemic allergic reactions, such as anaphylaxis which occurs with true food allergies.   - for SKIN only reaction, okay to take Benadryl 25mg  capsules every 6 hours as needed - for SKIN + ANY additional symptoms, OR IF concern for LIFE THREATENING reaction = Epipen  Autoinjector EpiPen  0.3 mg. - If using Epinephrine  autoinjector, call 911 or go to the ER.    Follow up: in 6 months

## 2023-12-17 NOTE — Progress Notes (Unsigned)
 FOLLOW UP Date of Service/Encounter:  12/20/23  Subjective:  Amanda Blackburn (DOB: [birth year]) is a 23 y.o. female who [facility] in re-evaluation of the following: asthma, rhinitis on AIT, OAS, dermatitis  History obtained from: chart review and patient.  For Review, LV was on 09/17/23  with Dr. Tobie seen for routine follow-up. See below for summary of history and diagnostics.   Therapeutic plans/changes recommended: Restarted allergy  medications and allergy  injections, recommended extended patch testing for dermatitis. ----------------------------------------------------- Pertinent History/Diagnostics:  - Asthma: intermittent              - normal  spirometry (02/04/22): ratio 71%, 81%, 2.33L FEV1 (pre),    -  - Allergic Rhinitis:              - SPT environmental panel (04/01/2019): Positive to grass, weeds, trees, molds, cat, mite             - On AIT, restarted 01/2022, vial 1 (G-T-W-C); vial 2 (M-DM) - PFAS              - Hx of reaction: oral pruritus with fresh fruits and vegetables    - Lip  Dermatitis              - 11/223: negative True Test              - controlled with elidel /hydrocortisone  as needed              - Followed by dermatology   Today presents for follow-up. Discussed the use of AI scribe software for clinical note transcription with the patient, who gave verbal consent to proceed.  History of Present Illness Amanda Blackburn is a 23 year old female who presents for follow-up on allergy  injections and management of allergic symptoms.   She has been receiving weekly allergy  injections since March and remains consistent with her appointments, after restarting AIT.  Recently, she experienced a significant local reaction to an injection, she is not on epi-rinses yet.  Her previously uncontrolled rash has resolved, and she has stopped using Elidel  daily. She is interested in extended patch testing to identify potential  allergens, particularly in products like hand sanitizers.  Her breathing tests have shown improvement. She continues to take Singulair , Zyrtec , and Flonase  regularly, and uses albuterol  occasionally, especially when experiencing nighttime breathing difficulties.  She avoids apples and carrots due to oral allergy  symptoms, such as mouth itching, but has reintroduced spinach, kale, and broccoli without adverse effects. She wishes to understand why certain foods cause these reactions.  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 She plans to maintain her allergy  treatment schedule around her academic commitments.  She is still interested in patch testing.  Her skin has improved significantly however she has not needed topical steroids in many months.   All medications reviewed by clinical staff and updated in chart. No new pertinent medical or surgical history except as noted in HPI.  ROS: All others negative except as noted per HPI.   Objective:  BP 100/70 (BP Location: Left Arm, Patient Position: Sitting, Cuff Size: Normal)   Pulse 70   Temp 97.9 F (36.6 C) (Temporal)   Resp 18   Ht 5' 2 (1.575 m)   Wt 161 lb (73 kg)   SpO2 99%   BMI 29.45 kg/m  Body mass index is 29.45 kg/m.  Physical Exam: General Appearance:  Alert, cooperative, no distress, appears stated age  Head:  Normocephalic, without obvious abnormality, atraumatic  Eyes:  Conjunctiva clear, EOM's intact  Ears EACs normal bilaterally  Nose: Nares normal, Pale mucosa, hypertrophic turbinates, no visible anterior polyps, and septum midline  Throat: Lips, tongue normal; teeth and gums normal, + cobblestoning  Neck: Supple, symmetrical  Lungs:   clear to auscultation bilaterally, Respirations unlabored, no coughing  Heart:  regular rate and rhythm and no murmur, Appears well perfused  Extremities: No edema  Skin: Skin color,  texture, turgor normal and no rashes or lesions on visualized portions of skin  Neurologic: No gross deficits   Labs:  Lab Orders  No laboratory test(s) ordered today    Spirometry:  Tracings reviewed. Her effort: Good reproducible efforts. FVC: 3.33 L FEV1: 2.30 L, 83% predicted FEV1/FVC ratio: 69% Interpretation: Spirometry consistent with normal pattern.  Please see scanned spirometry results for details.    Assessment/Plan  Mild intermittent asthma without complication - Plan: Spirometry with Graph  Seasonal and perennial allergic rhinitis  Pollen-food allergy , subsequent encounter  Other chronic allergic conjunctivitis of both eyes  Allergic contact dermatitis due to other agents  Patient Instructions  Dermatitis,  - Use a gentle, unscented cleanser at the end of the bath (such as Dove unscented bar or baby wash, or Aveeno sensitive body wash). Then rinse, pat half-way dry, and apply a gentle, unscented moisturizer cream or ointment (Cerave, Cetaphil, Eucerin, Aveeno, Aquaphor, Vanicream, Vaseline)  all over while still damp. Dry skin makes the itching and rash worse. The skin should be moisturized with a gentle, unscented moisturizer at least twice daily.  - Use only unscented liquid laundry detergent. - Apply prescribed topical steroid (triamcinolone  0.1% below neck or hydrocortisone  2.5% above neck) to flared areas (red and thickened eczema) after the moisturizer has soaked into the skin (wait at least 30 minutes). Taper off the topical steroids as the skin improves. Do not use topical steroid for more than 7-10 days at a time.  - Put  Elidel  1% onto areas of rough eczema twice a day. May decrease to once a day as the eczema improves. This will not thin the skin, and is safe for chronic use. Do not put this onto normal appearing skin. - Schedule patch testing with NAC 80.    Mild intermittent asthma  Breathing test: Looked great - Maintenance inhaler: continue  Singulair  10mg  daily  - Rescue inhaler: Albuterol  2 puffs every 4-6 hours as needed for respiratory symptoms of shortness of breath, or wheezing Asthma control goals:  Full participation in all desired activities (may need albuterol  before activity) Albuterol  use two times or less a week on average (not counting use with activity) Cough interfering with sleep two times or less a month Oral steroids no more than once a year No hospitalizations   Seasonal and perennial allergic rhinitis (grasses, weeds, trees, ragweed, mold, dust mite, cat)  - Continue  Flonase  2 sprays per nostril daily.  Aim upward and outward.   - Continue  Zyrtec  10mg  daily. - Continue Singulair  10mg  daily.  - Continue  allergy  injections.  Bring Epipen    Pollen-food allergy , this should improve once you get on Maintenance for allergy  injections  - These symptoms are typically not life-threatening and are because of a cross reaction between a pollen you are allergic to, and to a protein in specific foods (such as fresh fruits, vegetables, and nuts). - If you can eat these things  and tolerate the symptoms, it is fine to continue to do so.  If not, you may avoid these fresh fruits and vegetables.   - Heating these foods, buying them canned, and peeling these foods should allow them to be consumed without symptoms or with less symptoms. - Patients typically report itching and/or mild swelling of the mouth and throat immediately following ingestion of certain uncooked fruits (including nuts) or raw vegetables.  - Only a very small number of affected individuals experience systemic allergic reactions, such as anaphylaxis which occurs with true food allergies.   - for SKIN only reaction, okay to take Benadryl 25mg  capsules every 6 hours as needed - for SKIN + ANY additional symptoms, OR IF concern for LIFE THREATENING reaction = Epipen  Autoinjector EpiPen  0.3 mg. - If using Epinephrine  autoinjector, call 911 or go to the ER.     Follow up: in 6 months   Other: allergy  injection given in clinic today  Thank you so much for letting me partake in your care today.  Don't hesitate to reach out if you have any additional concerns!  Hargis Springer, MD  [facility] [place] [place]

## 2023-12-24 ENCOUNTER — Ambulatory Visit (INDEPENDENT_AMBULATORY_CARE_PROVIDER_SITE_OTHER)

## 2023-12-24 DIAGNOSIS — J309 Allergic rhinitis, unspecified: Secondary | ICD-10-CM

## 2023-12-27 ENCOUNTER — Ambulatory Visit (INDEPENDENT_AMBULATORY_CARE_PROVIDER_SITE_OTHER)

## 2023-12-27 DIAGNOSIS — J309 Allergic rhinitis, unspecified: Secondary | ICD-10-CM | POA: Diagnosis not present

## 2023-12-30 ENCOUNTER — Ambulatory Visit (INDEPENDENT_AMBULATORY_CARE_PROVIDER_SITE_OTHER)

## 2023-12-30 DIAGNOSIS — J309 Allergic rhinitis, unspecified: Secondary | ICD-10-CM

## 2024-01-12 ENCOUNTER — Ambulatory Visit (INDEPENDENT_AMBULATORY_CARE_PROVIDER_SITE_OTHER)

## 2024-01-12 DIAGNOSIS — J309 Allergic rhinitis, unspecified: Secondary | ICD-10-CM

## 2024-01-17 ENCOUNTER — Ambulatory Visit (INDEPENDENT_AMBULATORY_CARE_PROVIDER_SITE_OTHER)

## 2024-01-17 DIAGNOSIS — J309 Allergic rhinitis, unspecified: Secondary | ICD-10-CM

## 2024-01-20 ENCOUNTER — Ambulatory Visit

## 2024-01-21 ENCOUNTER — Ambulatory Visit

## 2024-01-21 DIAGNOSIS — J309 Allergic rhinitis, unspecified: Secondary | ICD-10-CM

## 2024-01-26 ENCOUNTER — Ambulatory Visit

## 2024-01-27 DIAGNOSIS — J3081 Allergic rhinitis due to animal (cat) (dog) hair and dander: Secondary | ICD-10-CM | POA: Diagnosis not present

## 2024-01-27 NOTE — Progress Notes (Signed)
 VIALS MADE ON 01/27/24

## 2024-01-28 DIAGNOSIS — J3089 Other allergic rhinitis: Secondary | ICD-10-CM | POA: Diagnosis not present

## 2024-02-03 ENCOUNTER — Ambulatory Visit (INDEPENDENT_AMBULATORY_CARE_PROVIDER_SITE_OTHER)

## 2024-02-03 DIAGNOSIS — J309 Allergic rhinitis, unspecified: Secondary | ICD-10-CM

## 2024-02-09 ENCOUNTER — Ambulatory Visit

## 2024-02-09 DIAGNOSIS — J309 Allergic rhinitis, unspecified: Secondary | ICD-10-CM

## 2024-02-16 ENCOUNTER — Ambulatory Visit (INDEPENDENT_AMBULATORY_CARE_PROVIDER_SITE_OTHER)

## 2024-02-16 DIAGNOSIS — J309 Allergic rhinitis, unspecified: Secondary | ICD-10-CM

## 2024-02-23 ENCOUNTER — Ambulatory Visit (INDEPENDENT_AMBULATORY_CARE_PROVIDER_SITE_OTHER)

## 2024-02-23 DIAGNOSIS — J309 Allergic rhinitis, unspecified: Secondary | ICD-10-CM | POA: Diagnosis not present

## 2024-03-03 ENCOUNTER — Other Ambulatory Visit (HOSPITAL_COMMUNITY): Payer: Self-pay

## 2024-03-03 ENCOUNTER — Ambulatory Visit (INDEPENDENT_AMBULATORY_CARE_PROVIDER_SITE_OTHER)

## 2024-03-03 ENCOUNTER — Encounter (HOSPITAL_COMMUNITY): Payer: Self-pay

## 2024-03-03 ENCOUNTER — Ambulatory Visit (HOSPITAL_COMMUNITY)
Admission: EM | Admit: 2024-03-03 | Discharge: 2024-03-03 | Disposition: A | Discharge status: Home / Self Care | Attending: Emergency Medicine | Admitting: Emergency Medicine

## 2024-03-03 DIAGNOSIS — S83001A Unspecified subluxation of right patella, initial encounter: Secondary | ICD-10-CM

## 2024-03-03 DIAGNOSIS — J309 Allergic rhinitis, unspecified: Secondary | ICD-10-CM | POA: Diagnosis not present

## 2024-03-03 MED ORDER — IBUPROFEN 800 MG PO TABS
800.0000 mg | ORAL_TABLET | Freq: Three times a day (TID) | ORAL | 0 refills | Status: AC
  Filled 2024-03-03: qty 21, 7d supply, fill #0

## 2024-03-03 NOTE — Discharge Instructions (Signed)
 It appears that your kneecap temporarily dislocated.  This can cause swelling, internal bruising, pain and stiffness.  You can ice and elevate your leg.  Ibuprofen  can be taken every 8 hours with food to help with pain and swelling.  The knee sleeve can help provide compression and support.  I have attached some exercises that you can do, 6-8 reps and repeat 4 times each.  If pain persist without improvement over the next week or so please follow-up with an orthopedic for advanced evaluation.  Return to clinic for new or urgent symptoms.

## 2024-03-03 NOTE — ED Triage Notes (Signed)
 Patient presenting with right knee pain after a fall yesterday while on school campus.   Having pain, swelling, and reduced range of motion in the right knee.
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
## 2024-03-06 ENCOUNTER — Ambulatory Visit (INDEPENDENT_AMBULATORY_CARE_PROVIDER_SITE_OTHER)

## 2024-03-06 DIAGNOSIS — J309 Allergic rhinitis, unspecified: Secondary | ICD-10-CM

## 2024-03-08 ENCOUNTER — Encounter (HOSPITAL_COMMUNITY): Payer: Self-pay | Admitting: Emergency Medicine

## 2024-03-08 ENCOUNTER — Other Ambulatory Visit: Payer: Self-pay

## 2024-03-08 ENCOUNTER — Ambulatory Visit (HOSPITAL_COMMUNITY)
Admission: EM | Admit: 2024-03-08 | Discharge: 2024-03-08 | Disposition: A | Discharge status: Home / Self Care | Attending: Internal Medicine | Admitting: Internal Medicine

## 2024-03-08 ENCOUNTER — Ambulatory Visit (INDEPENDENT_AMBULATORY_CARE_PROVIDER_SITE_OTHER)

## 2024-03-08 DIAGNOSIS — M25561 Pain in right knee: Secondary | ICD-10-CM | POA: Diagnosis not present

## 2024-03-08 DIAGNOSIS — M2241 Chondromalacia patellae, right knee: Secondary | ICD-10-CM | POA: Diagnosis not present

## 2024-03-08 NOTE — ED Triage Notes (Addendum)
 Reports knee cap dislocated in the midst of patient taking a step.   Patient is wearing a knee sleeve.  Patient has been taking ibuprofen  and following RICE   Patient is not able to put normal pressure on this leg due to pain  Patient is having continued stiffness and feeling knee pop particularly over the past 3 days  Points to either side of knee as painful to palpation and radiating upward into thigh
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
## 2024-03-08 NOTE — Discharge Instructions (Addendum)
 X-ray of the right knee done today.  Final evaluation by the radiologist does not show any acute abnormalities however the sunrise view does show a fair amount of lateral tilt to the patella.  Given the symptoms present would recommend following up with orthopedics for further evaluation.  Recommend using a hinged knee brace for any activity including standing or walking.  Would avoid doing long distance walking or heavy lifting until follow-up with orthopedics.  Can continue ibuprofen  but can alternate with Tylenol  as well. Ice the area 2-3 times daily for 10-15 minutes to help with pain and swelling. Do not apply ice directly to the skin.    Return to urgent care or PCP if symptoms worsen or fail to resolve.

## 2024-03-13 DIAGNOSIS — M222X1 Patellofemoral disorders, right knee: Secondary | ICD-10-CM | POA: Diagnosis not present

## 2024-03-14 ENCOUNTER — Ambulatory Visit (INDEPENDENT_AMBULATORY_CARE_PROVIDER_SITE_OTHER)

## 2024-03-14 DIAGNOSIS — J309 Allergic rhinitis, unspecified: Secondary | ICD-10-CM | POA: Diagnosis not present

## 2024-03-21 ENCOUNTER — Ambulatory Visit (INDEPENDENT_AMBULATORY_CARE_PROVIDER_SITE_OTHER)

## 2024-03-21 DIAGNOSIS — J309 Allergic rhinitis, unspecified: Secondary | ICD-10-CM | POA: Diagnosis not present

## 2024-03-28 ENCOUNTER — Ambulatory Visit (INDEPENDENT_AMBULATORY_CARE_PROVIDER_SITE_OTHER)

## 2024-03-28 DIAGNOSIS — J309 Allergic rhinitis, unspecified: Secondary | ICD-10-CM

## 2024-04-05 ENCOUNTER — Ambulatory Visit: Admitting: Family Medicine

## 2024-04-05 ENCOUNTER — Telehealth: Payer: Self-pay

## 2024-04-05 NOTE — Telephone Encounter (Signed)
 Copied from CRM [id number]. Topic: Appointments - Transfer of Care >> Apr 05, 2024 11:48 AM Dedra B wrote: Pt is requesting to transfer FROM: Dr. Glade Hope Pt is requesting to transfer TO: Dr. Clotilda Single Reason for requested transfer: Mom/pt preference It is the responsibility of the team the patient would like to transfer to (Dr. Clotilda Single) to reach out to the patient if for any reason this transfer is not acceptable.

## 2024-04-05 NOTE — Telephone Encounter (Signed)
Nicholas with me. I have never seen her

## 2024-04-06 ENCOUNTER — Ambulatory Visit

## 2024-04-06 DIAGNOSIS — J309 Allergic rhinitis, unspecified: Secondary | ICD-10-CM

## 2024-04-10 NOTE — Progress Notes (Unsigned)
 Subjective:  Patient ID: Cheril Molly, female    DOB: 2001/04/16, 23 y.o.   MRN: [id number]  Patient Care Team: Deitra Morton Hummer, Nena, NP as PCP - General (Nurse Practitioner) Geofm Glade PARAS, MD as Consulting Physician (Internal Medicine)   Chief Complaint:  Establish Care   HPI: Bushra Denman is a 23 y.o. female presenting on 04/11/2024 for Establish Care   Discussed the use of AI scribe software for clinical note transcription with the patient, who gave verbal consent to proceed.  History of Present Illness Hubert Poteet is a 23 year old female who presents to establish care as a new patient.  She has a history of asthma, allergies, and eczema. She reports that her asthma is controlled unless she exercises intensely, at which point she needs her inhaler. She is currently using an albuterol  inhaler and takes spironolactone , but is unsure whether she is taking the 50 mg or 100 mg dose. She also uses triamcinolone , Elidel  1% cream, Singulair , Advil , hydrocortisone  2.5% cream, Flonase , Zyrtec , benzoyl peroxide , azelaic acid  15% gel, and an inhaler for asthma management.  She experiences chronic headaches that occur for about two and a half weeks each month. These headaches are severe enough to require her to sit in the dark and are not relieved by acetaminophen . The pain originates from the occipital region and affects her eyes and temporal region. Her mother also experiences similar headaches.  She temporarily dislocated her knee on March 02, 2024, and was informed by orthopedics that she has loose joints, flat feet, and weak hip strength. She experiences episodes of facial swelling that do not correlate with her menstrual cycle.  She maintains a healthy diet, drinking only water and avoiding seed oils and refined sugar. She exercises three to four times a week, focusing on arms and core due to her recent knee injury. She is concerned about low energy levels and is interested in  checking for deficiencies in cortisol, vitamins, and thyroid function.    LMP 03/11/2024  Relevant past medical, surgical, family, and social history reviewed and updated as indicated.  Allergies and medications reviewed and updated. Data reviewed: Chart in Epic.   Past Medical History:  Diagnosis Date   Allergy     Anxiety    Asthma    Depression    sees therapy    Eczema    Frequent headaches    Seasonal allergies     Past Surgical History:  Procedure Laterality Date   NO PAST SURGERIES      Social History   Socioeconomic History   Marital status: Single    Spouse name: Not on file   Number of children: Not on file   Years of education: Not on file   Highest education level: Not on file  Occupational History   Not on file  Tobacco Use   Smoking status: Never   Smokeless tobacco: Never  Vaping Use   Vaping status: Never Used  Substance and Sexual Activity   Alcohol use: No   Drug use: No   Sexual activity: Yes    Birth control/protection: None  Other Topics Concern   Not on file  Social History Narrative   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 , 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   Single as of 03/02/2019    No guns    Wears seat belt   Safe in relationship    No etoh or drugs    She wants to be a  behavioral analyst   Works [employer] in [employer]   Social Drivers of Health   Financial Resource Strain: Not on file  Food Insecurity: No Food Insecurity (04/11/2024)   Hunger Vital Sign    Worried About Running Out of Food in the Last Year: Never true    Ran Out of Food in the Last Year: Never true  Transportation Needs: No Transportation Needs (04/11/2024)   PRAPARE - Administrator, Civil Service (Medical): No    Lack of Transportation (Non-Medical): No  Physical Activity: Sufficiently Active (04/11/2024)   Exercise Vital Sign    Days of Exercise per Week: 4 days    Minutes of Exercise per Session: 80 min  Stress: Not on file  Social Connections:  Unknown (11/11/2021)   Received from [facility]   Social Network    Social Network: Not on file  Intimate Partner Violence: Not At Risk (04/11/2024)   Humiliation, Afraid, Rape, and Kick questionnaire    Fear of Current or Ex-Partner: No    Emotionally Abused: No    Physically Abused: No    Sexually Abused: No    Outpatient Encounter Medications as of 04/11/2024  Medication Sig   Azelaic Acid  15 % gel Apply to affected area topical every day   Azelaic Acid  15 % gel Apply to affected area daily as directed.   beclomethasone (QVAR  REDIHALER) 40 MCG/ACT inhaler Inhale 2 puffs into the lungs 2 (two) times daily. Rinse mouth after use.   Benzoyl Peroxide  ([employer] TARGETED ACNE SPOT) 2.5 % CREA Apply as needed to active areas   fluticasone  (FLONASE ) 50 MCG/ACT nasal spray Place 2 sprays into both nostrils daily.   hydrocortisone  2.5 % cream Apply twice daily for flare ups above neck, maximum 7 days.   ibuprofen  (ADVIL ) 800 MG tablet Take 1 tablet (800 mg total) by mouth 3 (three) times daily.   montelukast  (SINGULAIR ) 10 MG tablet Take 1 tablet (10 mg total) by mouth at bedtime.   pimecrolimus  (ELIDEL ) 1 % cream Apply topically 2 (two) times daily.   spironolactone  (ALDACTONE ) 100 MG tablet Take 1 tablet (100 mg total) by mouth daily.   spironolactone  (ALDACTONE ) 50 MG tablet Take 1 tablet (50 mg total) by mouth daily.   SUMAtriptan (IMITREX) 25 MG tablet Take 1 tablet (25 mg total) by mouth every 2 (two) hours as needed for migraine. May repeat in 2 hours if headache persists or recurs.   triamcinolone  ointment (KENALOG ) 0.1 % Apply twice daily for flare ups below neck, maximum 10 days.   [DISCONTINUED] albuterol  (PROAIR  HFA) 108 (90 Base) MCG/ACT inhaler Inhale 2 puffs into the lungs every 4 (four) hours as needed for wheezing or shortness of breath.   [DISCONTINUED] cefdinir  (OMNICEF ) 300 MG capsule Take 1 capsule (300 mg total) by mouth 2 (two) times daily.   [DISCONTINUED] cetirizine   (ZYRTEC  ALLERGY ) 10 MG tablet Take 1 tablet (10 mg total) by mouth daily.   [DISCONTINUED] Lactic Ac-Citric Ac-Pot Bitart (PHEXXI ) 1.8-1-0.4 % GEL Use 1 applicatorfull vaginally up to 1 hour prior to intercourse as directed   albuterol  (PROAIR  HFA) 108 (90 Base) MCG/ACT inhaler Inhale 2 puffs into the lungs every 4 (four) hours as needed for wheezing or shortness of breath.   cetirizine  (ZYRTEC  ALLERGY ) 10 MG tablet Take 1 tablet (10 mg total) by mouth daily.   [DISCONTINUED] predniSONE  (DELTASONE ) 20 MG tablet Take 1 tablet (20 mg total) by mouth 2 (two) times daily with a meal. (Patient not  taking: Reported on 04/11/2024)   No facility-administered encounter medications on file as of 04/11/2024.    Allergies  Allergen Reactions   Covid-19 (Mrna) Vaccine Diarrhea and Nausea And Vomiting    Fever   Hydroxyzine      In a daze   Prozac  [Fluoxetine ]     Note effective     Nickel Rash    Rash    Sulfa Antibiotics Rash    Rash     Pertinent ROS per HPI, otherwise unremarkable      Objective:  BP 91/61   Pulse 75   Temp 97.8 F (36.6 C) (Temporal)   Ht 5' 2 (1.575 m)   Wt 160 lb 6.4 oz (72.8 kg)   SpO2 97%   BMI 29.34 kg/m    Wt Readings from Last 3 Encounters:  04/11/24 160 lb 6.4 oz (72.8 kg)  12/17/23 161 lb (73 kg)  09/17/23 161 lb (73 kg)    Physical Exam Vitals and nursing note reviewed.  Constitutional:      Appearance: Normal appearance.  HENT:     Head: Normocephalic and atraumatic.     Right Ear: Tympanic membrane, ear canal and external ear normal. There is no impacted cerumen.     Left Ear: Tympanic membrane, ear canal and external ear normal.     Nose: Nose normal.     Mouth/Throat:     Mouth: Mucous membranes are moist.  Eyes:     General: No scleral icterus.    Extraocular Movements: Extraocular movements intact.     Conjunctiva/sclera: Conjunctivae normal.     Pupils: Pupils are equal, round, and reactive to light.  Neck:     Vascular: No  carotid bruit.  Cardiovascular:     Heart sounds: Normal heart sounds.  Pulmonary:     Effort: Pulmonary effort is normal.     Breath sounds: Normal breath sounds.  Musculoskeletal:        General: Normal range of motion.     Cervical back: Normal range of motion and neck supple. No rigidity or tenderness.     Right lower leg: No edema.     Left lower leg: No edema.  Lymphadenopathy:     Cervical: No cervical adenopathy.  Skin:    General: Skin is warm and dry.     Findings: No rash.  Neurological:     Mental Status: She is alert and oriented to person, place, and time.  Psychiatric:        Mood and Affect: Mood normal.        Behavior: Behavior normal.        Thought Content: Thought content normal.        Judgment: Judgment normal.    Physical Exam      Results for orders placed or performed in visit on 05/03/23  Cytology - PAP( [place])   Collection Time: 05/03/23  4:15 PM  Result Value Ref Range   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   Diagnosis      - Negative for intraepithelial lesion or malignancy (NILM)   Comment Normal Reference Range HPV - Negative   Cervicovaginal ancillary only( [place])   Collection Time: 05/03/23  4:15 PM  Result Value Ref Range   Neisseria Gonorrhea Negative    Chlamydia Negative    Comment Normal Reference [place] Chlamydia - Negative    Comment  Normal Reference Range Neisseria Gonorrhea - Negative  HIV antibody (with reflex)   Collection Time: 05/03/23  4:17 PM  Result Value Ref Range   HIV Screen 4th Generation wRfx Non Reactive Non Reactive  Hepatitis C Antibody   Collection Time: 05/03/23  4:17 PM  Result Value Ref Range   Hep C Virus Ab Non Reactive Non Reactive  Hepatitis B Surface AntiGEN   Collection Time: 05/03/23  4:17 PM  Result Value Ref Range   Hepatitis B Surface Ag Negative Negative  RPR   Collection Time: 05/03/23  4:17  PM  Result Value Ref Range   RPR Ser Ql Non Reactive Non Reactive  POCT urine pregnancy   Collection Time: 05/03/23  4:24 PM  Result Value Ref Range   Preg Test, Ur Negative Negative       Pertinent labs & imaging results that were available during my care of the patient were reviewed by me and considered in my medical decision making.  Assessment & Plan:  Lauralye was seen today for establish care.  Diagnoses and all orders for this visit:  Encounter for general adult medical examination with abnormal findings -     CBC with Differential/Platelet -     CMP14+EGFR -     Lipid panel -     Thyroid Panel With TSH -     VITAMIN D  25 Hydroxy (Vit-D Deficiency, Fractures)  Mild intermittent asthma without complication -     CBC with Differential/Platelet -     CMP14+EGFR -     albuterol  (PROAIR  HFA) 108 (90 Base) MCG/ACT inhaler; Inhale 2 puffs into the lungs every 4 (four) hours as needed for wheezing or shortness of breath.  Eczema, unspecified type  Acne, unspecified acne type  Low energy -     Thyroid Panel With TSH -     VITAMIN D  25 Hydroxy (Vit-D Deficiency, Fractures)  Pes planus of both feet -     Ambulatory referral to Podiatry  Chronic tension-type headache, intractable -     POCT urine pregnancy -     SUMAtriptan (IMITREX) 25 MG tablet; Take 1 tablet (25 mg total) by mouth every 2 (two) hours as needed for migraine. May repeat in 2 hours if headache persists or recurs.  Allergic rhinitis, unspecified seasonality, unspecified trigger -     cetirizine  (ZYRTEC  ALLERGY ) 10 MG tablet; Take 1 tablet (10 mg total) by mouth daily.     Assessment and Plan Anjelique is a 23 year old African-American female seen today to establish care, no acute distress Assessment & Plan Adult Wellness Visit New patient with asthma, allergies, eczema, low energy, and occasional facial swelling. Suspected deficiency. - Order CBC for anemia and infection. - Order CMP for kidney and liver  function. - Order thyroid function test. - Order vitamin D  level. - Perform pregnancy test before prescribing Imitrex. - Discuss lab results once available.  Chronic headaches Debilitating headaches 2.5 weeks per month, family history present. Excedrin not tolerated. - Prescribe Imitrex 25 mg, take at onset and repeat in 2 hours if needed.  Asthma Well-controlled, requires inhaler during intense exercise. - Refill albuterol  inhaler.  Allergic rhinitis Seasonal allergies, Zyrtec  tolerated, hydroxyzine  allergy  present. - Refill Zyrtec . - Advise daily allergy  medication due to severe turbinates.  Flat feet (bilateral) and joint hypermobility Contributes to knee dislocation, advised better shoes. - Refer to podiatry for evaluation and possible orthotic inserts.      Continue all other maintenance medications.  Follow up plan: Return in  about 6 months (around 10/10/2024) for chronicDiseases   Management .   Continue healthy lifestyle choices, including diet (rich in fruits, vegetables, and lean proteins, and low in salt and simple carbohydrates) and exercise (at least 30 minutes of moderate physical activity daily).  Educational handout given for    Clinical References  Acne  Acne is a skin problem that causes small, red bumps (pimples or papules) and other skin changes. Your skin has tiny holes called pores. Each pore has an oil gland. Acne happens when pores get blocked. Your pores may get red, sore, and swollen. They may also get infected. Acne is common among teens. Acne usually goes away with time. What are the causes? Acne is caused when: Oil glands get blocked by oil, dead skin cells, and dirt. Germs (bacteria) that live in your oil glands grow in number and cause infection. Acne can start with changes in hormones. These changes can make acne worse. They occur: During your teen years (adolescence). During your monthly period (menstrual cycle). If you get  pregnant. Other things that can make acne worse include: Makeup, creams, and hair products that have oil in them. Stress. Diseases that cause changes in hormones. Some medicines. Tight headbands, backpacks, or shoulder pads. Being near certain oils and chemicals. Foods that are high in sugars. These include dairy products, sweets, and chocolates. What increases the risk? Being a teen. Having people in your family who have had acne. What are the signs or symptoms? Symptoms of this condition include: Small, red bumps. Whiteheads. Blackheads. Small, pus-filled bumps (pustules). Big, red bumps that feel tender. Acne that is very bad can cause: Abscesses. These are areas on your body that have pus. Cysts. These are hard, painful sacs that have fluid. Scars. These can form after large pimples heal. How is this treated? Treatment for acne depends on how bad your acne is. It may include: Creams and lotions. These can: Keep the pores of your skin open. Treat or prevent infections and swelling. Medicines that treat infections (antibiotics). These can be put on your skin or taken as pills. Pills that lower the amount of oil in your skin. Birth control pills. Treatments using lights or lasers. Shots of medicine into the areas with acne. Chemicals that make the skin peel. Surgery. Your doctor will also tell you the best way to take care of your skin. Follow these instructions at home: Skin care Good skin care is the best thing you can do to treat your acne. Take care of your skin as told by your doctor. You may be told to do these things: Wash your skin gently. Wash at least two times each day. Also, wash: After you exercise. Before you go to bed. Use mild soap. After you wash your skin, put a water-based lotion on it for moisture. Use a sunscreen or sunblock with SPF 30 or greater. Put this on often. Acne medicines may make it easier for your skin to burn in the sun. Choose makeup  and creams that will not block your oil glands (are noncomedogenic). Medicines Take over-the-counter and prescription medicines only as told by your doctor. If you were prescribed antibiotics, use them as told by your doctor. Do not stop using them even if your acne gets better. General instructions Keep your hair clean and off your face. If you have oily hair, shampoo it often or daily. Avoid wearing tight headbands or hats. Avoid picking or squeezing your pimples. Picking or squeezing can make acne worse  and make scars form. Shave gently. Shave only when you have to. Keep a food journal. This can help you see if any foods are linked to your acne. Try to deal with and lower your stress. Keep all follow-up visits. Your doctor needs to watch for changes in your acne and may need to change your treatments. Contact a doctor if: Your acne is not better after 8 weeks. Your acne gets worse. A large area of your skin gets red or tender. You think that you are having side effects from any acne medicine. This information is not intended to replace advice given to you by your health care provider. Make sure you discuss any questions you have with your health care provider. Document Revised: 11/20/2021 Document Reviewed: 11/20/2021 Elsevier Patient Education  2024 Else[employer] Inc. Tension Headache, Adult A tension headache is a feeling of pain, pressure, or aching in the head. It is often felt over the front and sides of the head. Tension headaches can last from 30 minutes to several days. What are the causes? The cause of this condition is not known. Sometimes, tension headaches are brought on by stress, worry (anxiety), or depression. Other things that may set them off include: Alcohol. Too much caffeine or caffeine withdrawal. Colds, flu, or sinus infections. Dental problems. This can include clenching your teeth. Being tired. Holding your head and neck in the same position for a long time, such  as while using a computer. Smoking. Arthritis in the neck. What are the signs or symptoms? Feeling pressure around the head. A dull ache in the head. Pain over the front and sides of the head. Feeling sore or tender in the muscles of the head, neck, and shoulders. How is this treated? This condition may be treated with lifestyle changes and with medicines that help relieve symptoms. Follow these instructions at home: Managing pain Take over-the-counter and prescription medicines only as told by your doctor. When you have a headache, lie down in a dark, quiet room. If told, put ice on your head and neck. To do this: Put ice in a plastic bag. Place a towel between your skin and the bag. Leave the ice on for 20 minutes, 2-3 times a day. Take off the ice if your skin turns bright red. This is very important. If you cannot feel pain, heat, or cold, you have a greater risk of damage to the area. If told, put heat on the back of your neck. Do this as often as told by your doctor. Use the heat source that your doctor recommends, such as a moist heat pack or a heating pad. Place a towel between your skin and the heat source. Leave the heat on for 20-30 minutes. Take off the heat if your skin turns bright red. This is very important. If you cannot feel pain, heat, or cold, you have a greater risk of getting burned. Eating and drinking Eat meals on a regular schedule. If you drink alcohol: Limit how much you have to: 0-1 drink a day for women who are not pregnant. 0-2 drinks a day for men. Know how much alcohol is in your drink. In the [place] one drink equals one 12 oz bottle of beer (355 mL), one 5 oz glass of wine (148 mL), or one 1 oz glass of hard liquor (44 mL). Drink enough fluid to keep your pee (urine) pale yellow. Do not use a lot of caffeine, or stop using caffeine. Lifestyle Get 7-9 hours of sleep  each night. Or get the amount of sleep that your doctor tells you to. At bedtime, keep  computers, phones, and tablets out of your room. Find ways to lessen your stress. This may include: Exercise. Deep breathing. Yoga. Listening to music. Thinking positive thoughts. Sit up straight. Try to relax your muscles. Do not smoke or use any products that contain nicotine or tobacco. If you need help quitting, ask your doctor. General instructions  Avoid things that can bring on headaches. Keep a headache journal to see what may bring on headaches. For example, write down: What you eat and drink. How much sleep you get. Any change to your diet or medicines. Keep all follow-up visits. Contact a doctor if: Your headache does not get better. Your headache comes back. You have a headache, and sounds, light, or smells bother you. You feel like you may vomit, or you vomit. Your stomach hurts. Get help right away if: You all of a sudden get a very bad headache with any of these things: A stiff neck. Feeling like you may vomit. Vomiting. Feeling mixed up (confused). Feeling weak in one part or one side of your body. Having trouble seeing or speaking, or both. Feeling short of breath. A rash. Feeling very sleepy. Pain in your eye or ear. Trouble walking or balancing. Feeling like you will faint, or you faint. Summary A tension headache is pain, pressure, or aching in your head. Tension headaches can last from 30 minutes to several days. Lifestyle changes and medicines may help relieve pain. This information is not intended to replace advice given to you by your health care provider. Make sure you discuss any questions you have with your health care provider. Document Revised: 03/12/2023 Document Reviewed: 03/12/2023 Elsevier Patient Education  2024 Elsevier Inc. Health Maintenance, Female Adopting a healthy lifestyle and getting preventive care are important in promoting health and wellness. Ask your health care provider about: The right schedule for you to have regular  tests and exams. Things you can do on your own to prevent diseases and keep yourself healthy. What should I know about diet, weight, and exercise? Eat a healthy diet  Eat a diet that includes plenty of vegetables, fruits, low-fat dairy products, and lean protein. Do not eat a lot of foods that are high in solid fats, added sugars, or sodium. Maintain a healthy weight Body mass index (BMI) is used to identify weight problems. It estimates body fat based on height and weight. Your health care provider can help determine your BMI and help you achieve or maintain a healthy weight. Get regular exercise Get regular exercise. This is one of the most important things you can do for your health. Most adults should: Exercise for at least 150 minutes each week. The exercise should increase your heart rate and make you sweat (moderate-intensity exercise). Do strengthening exercises at least twice a week. This is in addition to the moderate-intensity exercise. Spend less time sitting. Even light physical activity can be beneficial. Watch cholesterol and blood lipids Have your blood tested for lipids and cholesterol at 24 years of age, then have this test every 5 years. Have your cholesterol levels checked more often if: Your lipid or cholesterol levels are high. You are older than 24 years of age. You are at high risk for heart disease. What should I know about cancer screening? Depending on your health history and family history, you may need to have cancer screening at various ages. This may include screening for:  Breast cancer. Cervical cancer. Colorectal cancer. Skin cancer. Lung cancer. What should I know about heart disease, diabetes, and high blood pressure? Blood pressure and heart disease High blood pressure causes heart disease and increases the risk of stroke. This is more likely to develop in people who have high blood pressure readings or are overweight. Have your blood pressure  checked: Every 3-5 years if you are 58-19 years of age. Every year if you are 27 years old or older. Diabetes Have regular diabetes screenings. This checks your fasting blood sugar level. Have the screening done: Once every three years after age 45 if you are at a normal weight and have a low risk for diabetes. More often and at a younger age if you are overweight or have a high risk for diabetes. What should I know about preventing infection? Hepatitis B If you have a higher risk for hepatitis B, you should be screened for this virus. Talk with your health care provider to find out if you are at risk for hepatitis B infection. Hepatitis C Testing is recommended for: Everyone born from [birth year] through 1966. Anyone with known risk factors for hepatitis C. Sexually transmitted infections (STIs) Get screened for STIs, including gonorrhea and chlamydia, if: You are sexually active and are younger than 24 years of age. You are older than 24 years of age and your health care provider tells you that you are at risk for this type of infection. Your sexual activity has changed since you were last screened, and you are at increased risk for chlamydia or gonorrhea. Ask your health care provider if you are at risk. Ask your health care provider about whether you are at high risk for HIV. Your health care provider may recommend a prescription medicine to help prevent HIV infection. If you choose to take medicine to prevent HIV, you should first get tested for HIV. You should then be tested every 3 months for as long as you are taking the medicine. Pregnancy If you are about to stop having your period (premenopausal) and you may become pregnant, seek counseling before you get pregnant. Take 400 to 800 micrograms (mcg) of folic acid every day if you become pregnant. Ask for birth control (contraception) if you want to prevent pregnancy. Osteoporosis and menopause Osteoporosis is a disease in which the bones  lose minerals and strength with aging. This can result in bone fractures. If you are 52 years old or older, or if you are at risk for osteoporosis and fractures, ask your health care provider if you should: Be screened for bone loss. Take a calcium or vitamin D  supplement to lower your risk of fractures. Be given hormone replacement therapy (HRT) to treat symptoms of menopause. Follow these instructions at home: Alcohol use Do not drink alcohol if: Your health care provider tells you not to drink. You are pregnant, may be pregnant, or are planning to become pregnant. If you drink alcohol: Limit how much you have to: 0-1 drink a day. Know how much alcohol is in your drink. In the [place] one drink equals one 12 oz bottle of beer (355 mL), one 5 oz glass of wine (148 mL), or one 1 oz glass of hard liquor (44 mL). Lifestyle Do not use any products that contain nicotine or tobacco. These products include cigarettes, chewing tobacco, and vaping devices, such as e-cigarettes. If you need help quitting, ask your health care provider. Do not use street drugs. Do not share needles. Ask your health  care provider for help if you need support or information about quitting drugs. General instructions Schedule regular health, dental, and eye exams. Stay current with your vaccines. Tell your health care provider if: You often feel depressed. You have ever been abused or do not feel safe at home. Summary Adopting a healthy lifestyle and getting preventive care are important in promoting health and wellness. Follow your health care provider's instructions about healthy diet, exercising, and getting tested or screened for diseases. Follow your health care provider's instructions on monitoring your cholesterol and blood pressure. This information is not intended to replace advice given to you by your health care provider. Make sure you discuss any questions you have with your health care provider. Document  Revised: 11/04/2020 Document Reviewed: 11/04/2020 Elsevier Patient Education  2024 Elsevier Inc. Asthma, Adult  Asthma is a condition that causes swelling and narrowing of the airways. These are the passages that lead from the nose and mouth down into the lungs. When asthma symptoms get worse it is called an asthma attack or flare. This can make it hard to breathe. Asthma flares can range from minor to life-threatening. There is no cure for asthma, but medicines and lifestyle changes can help to control it. What are the causes? It is not known exactly what causes asthma, but certain things can cause asthma symptoms to get worse (triggers). What can trigger an asthma attack? Cigarette smoke. Mold. Dust. Your pet's skin flakes (dander). Cockroaches. Pollen. Air pollution (like household cleaners, wood smoke, smog, or Therapist, occupational). What are the signs or symptoms? Trouble breathing (shortness of breath). Coughing. Making high-pitched whistling sounds when you breathe, most often when you breathe out (wheezing). Chest tightness. Tiredness with little activity. Poor exercise tolerance. How is this treated? Controller medicines that help prevent asthma symptoms. Fast-acting reliever or rescue medicines. These give short-term relief of asthma symptoms. Allergy  medicines if your attacks are brought on by allergens. Medicines to help control the body's defense (immune) system. Staying away from the things that cause asthma attacks. Follow these instructions at home: Avoiding triggers in your home Do not allow anyone to smoke in your home. Limit use of fireplaces and wood stoves. Get rid of pests (such as roaches and mice) and their droppings. Keep your home clean. Clean your floors. Dust regularly. Use cleaning products that do not smell. Wash bed sheets and blankets every week in hot water. Dry them in a dryer. Have someone vacuum when you are not home. Change your heating and air  conditioning filters often. Use blankets that are made of polyester or cotton. General instructions Take over-the-counter and prescription medicines only as told by your doctor. Do not smoke or use any products that contain nicotine or tobacco. If you need help quitting, ask your doctor. Stay away from secondhand smoke. Avoid doing things outdoors when allergen counts are high and when air quality is low. Warm up before you exercise. Take time to cool down after exercise. Use a peak flow meter as told by your doctor. A peak flow meter is a tool that measures how well your lungs are working. Keep track of the peak flow meter's readings. Write them down. Follow your asthma action plan. This is a written plan for taking care of your asthma and treating your attacks. Make sure you get all the shots (vaccines) that your doctor recommends. Ask your doctor about a flu shot and a pneumonia shot. Keep all follow-up visits. Contact a doctor if: You have wheezing, shortness  of breath, or a cough even while taking medicine to prevent attacks. The mucus you cough up (sputum) is thicker than usual. The mucus you cough up changes from clear or white to yellow, green, gray, or is bloody. You have problems from the medicine you are taking, such as: A rash. Itching. Swelling. Trouble breathing. You need reliever medicines more than 2-3 times a week. Your peak flow reading is still at 50-79% of your personal best after following the action plan for 1 hour. You have a fever. Get help right away if: You seem to be worse and are not responding to medicine during an asthma attack. You are short of breath even at rest. You get short of breath when doing very little activity. You have trouble eating, drinking, or talking. You have chest pain or tightness. You have a fast heartbeat. Your lips or fingernails start to turn blue. You are light-headed or dizzy, or you faint. Your peak flow is less than 50% of  your personal best. You feel too tired to breathe normally. These symptoms may be an emergency. Get help right away. Call 911. Do not wait to see if the symptoms will go away. Do not drive yourself to the hospital. Summary Asthma is a long-term (chronic) condition in which the airways get tight and narrow. An asthma attack can make it hard to breathe. Asthma cannot be cured, but medicines and lifestyle changes can help control it. Make sure you understand how to avoid triggers and how and when to use your medicines. Avoid things that can cause allergy  symptoms (allergens). These include animal skin flakes (dander) and pollen from trees or grass. Avoid things that pollute the air. These may include household cleaners, wood smoke, smog, or chemical odors. This information is not intended to replace advice given to you by your health care provider. Make sure you discuss any questions you have with your health care provider. Document Revised: 03/24/2021 Document Reviewed: 03/24/2021 Elsevier Patient Education  2024 Elsevier Inc.  The above assessment and management plan was discussed with the patient. The patient verbalized understanding of and has agreed to the management plan. Patient is aware to call the clinic if they develop any new symptoms or if symptoms persist or worsen. Patient is aware when to return to the clinic for a follow-up visit. Patient educated on when it is appropriate to go to the emergency department.   Cowan Pilar [place] Thompson, DNP [facility] [address] [place], [place] [address] [phone]

## 2024-04-11 ENCOUNTER — Other Ambulatory Visit (HOSPITAL_COMMUNITY): Payer: Self-pay

## 2024-04-11 ENCOUNTER — Encounter: Payer: Self-pay | Admitting: Nurse Practitioner

## 2024-04-11 ENCOUNTER — Other Ambulatory Visit: Payer: Self-pay

## 2024-04-11 ENCOUNTER — Ambulatory Visit (INDEPENDENT_AMBULATORY_CARE_PROVIDER_SITE_OTHER): Admitting: Nurse Practitioner

## 2024-04-11 VITALS — BP 91/61 | HR 75 | Temp 97.8°F | Ht 62.0 in | Wt 160.4 lb

## 2024-04-11 DIAGNOSIS — Z0001 Encounter for general adult medical examination with abnormal findings: Secondary | ICD-10-CM | POA: Diagnosis not present

## 2024-04-11 DIAGNOSIS — J4520 Mild intermittent asthma, uncomplicated: Secondary | ICD-10-CM | POA: Diagnosis not present

## 2024-04-11 DIAGNOSIS — M2141 Flat foot [pes planus] (acquired), right foot: Secondary | ICD-10-CM | POA: Diagnosis not present

## 2024-04-11 DIAGNOSIS — R5383 Other fatigue: Secondary | ICD-10-CM | POA: Insufficient documentation

## 2024-04-11 DIAGNOSIS — J309 Allergic rhinitis, unspecified: Secondary | ICD-10-CM | POA: Diagnosis not present

## 2024-04-11 DIAGNOSIS — L709 Acne, unspecified: Secondary | ICD-10-CM | POA: Diagnosis not present

## 2024-04-11 DIAGNOSIS — G44221 Chronic tension-type headache, intractable: Secondary | ICD-10-CM | POA: Insufficient documentation

## 2024-04-11 DIAGNOSIS — M2142 Flat foot [pes planus] (acquired), left foot: Secondary | ICD-10-CM | POA: Diagnosis not present

## 2024-04-11 DIAGNOSIS — L309 Dermatitis, unspecified: Secondary | ICD-10-CM

## 2024-04-11 LAB — LIPID PANEL

## 2024-04-11 MED ORDER — SUMATRIPTAN SUCCINATE 25 MG PO TABS
25.0000 mg | ORAL_TABLET | Freq: Every day | ORAL | 1 refills | Status: AC
  Filled 2024-04-11: qty 9, 15d supply, fill #0

## 2024-04-11 MED ORDER — CETIRIZINE HCL 10 MG PO TABS
10.0000 mg | ORAL_TABLET | Freq: Every day | ORAL | 1 refills | Status: DC
  Filled 2024-04-11: qty 90, 90d supply, fill #0

## 2024-04-11 MED ORDER — ALBUTEROL SULFATE HFA 108 (90 BASE) MCG/ACT IN AERS
2.0000 | INHALATION_SPRAY | RESPIRATORY_TRACT | 1 refills | Status: DC | PRN
  Filled 2024-04-11: qty 6.7, 20d supply, fill #0

## 2024-04-12 ENCOUNTER — Other Ambulatory Visit (HOSPITAL_COMMUNITY): Payer: Self-pay

## 2024-04-12 ENCOUNTER — Other Ambulatory Visit: Payer: Self-pay

## 2024-04-12 ENCOUNTER — Ambulatory Visit: Payer: Self-pay | Admitting: Nurse Practitioner

## 2024-04-12 ENCOUNTER — Ambulatory Visit

## 2024-04-12 DIAGNOSIS — E559 Vitamin D deficiency, unspecified: Secondary | ICD-10-CM

## 2024-04-12 DIAGNOSIS — J309 Allergic rhinitis, unspecified: Secondary | ICD-10-CM

## 2024-04-12 LAB — CBC WITH DIFFERENTIAL/PLATELET
Basophils Absolute: 0 x10E3/uL (ref 0.0–0.2)
Basos: 1 %
EOS (ABSOLUTE): 0.3 x10E3/uL (ref 0.0–0.4)
Eos: 7 %
Hematocrit: 41.2 % (ref 34.0–46.6)
Hemoglobin: 12.8 g/dL (ref 11.1–15.9)
Immature Grans (Abs): 0 x10E3/uL (ref 0.0–0.1)
Immature Granulocytes: 0 %
Lymphocytes Absolute: 1.8 x10E3/uL (ref 0.7–3.1)
Lymphs: 50 %
MCH: 27.1 pg (ref 26.6–33.0)
MCHC: 31.1 g/dL — ABNORMAL LOW (ref 31.5–35.7)
MCV: 87 fL (ref 79–97)
Monocytes Absolute: 0.2 x10E3/uL (ref 0.1–0.9)
Monocytes: 6 %
Neutrophils Absolute: 1.3 x10E3/uL — ABNORMAL LOW (ref 1.4–7.0)
Neutrophils: 36 %
Platelets: 269 x10E3/uL (ref 150–450)
RBC: 4.72 x10E6/uL (ref 3.77–5.28)
RDW: 12.1 % (ref 11.7–15.4)
WBC: 3.7 x10E3/uL (ref 3.4–10.8)

## 2024-04-12 LAB — LIPID PANEL
Cholesterol, Total: 161 mg/dL (ref 100–199)
HDL: 58 mg/dL (ref >39)
LDL CALC COMMENT:: 2.8 ratio (ref 0.0–4.4)
LDL Chol Calc (NIH): 96 mg/dL (ref 0–99)
Triglycerides: 28 mg/dL (ref 0–149)
VLDL Cholesterol Cal: 7 mg/dL (ref 5–40)

## 2024-04-12 LAB — CMP14+EGFR
ALT: 11 IU/L (ref 0–32)
AST: 14 IU/L (ref 0–40)
Albumin: 4.3 g/dL (ref 4.0–5.0)
Alkaline Phosphatase: 67 IU/L (ref 41–116)
BUN/Creatinine Ratio: 12 (ref 9–23)
BUN: 10 mg/dL (ref 6–20)
Bilirubin Total: 0.4 mg/dL (ref 0.0–1.2)
CO2: 22 mmol/L (ref 20–29)
Calcium: 9.7 mg/dL (ref 8.7–10.2)
Chloride: 102 mmol/L (ref 96–106)
Creatinine, Ser: 0.81 mg/dL (ref 0.57–1.00)
Globulin, Total: 3.1 g/dL (ref 1.5–4.5)
Glucose: 79 mg/dL (ref 70–99)
Potassium: 4.6 mmol/L (ref 3.5–5.2)
Sodium: 137 mmol/L (ref 134–144)
Total Protein: 7.4 g/dL (ref 6.0–8.5)
eGFR: 104 mL/min/1.73 (ref >59)

## 2024-04-12 LAB — THYROID PANEL WITH TSH
Free Thyroxine Index: 2.4 (ref 1.2–4.9)
T3 Uptake Ratio: 28 % (ref 24–39)
T4, Total: 8.4 ug/dL (ref 4.5–12.0)
TSH: 1.36 u[IU]/mL (ref 0.450–4.500)

## 2024-04-12 LAB — VITAMIN D 25 HYDROXY (VIT D DEFICIENCY, FRACTURES): Vit D, 25-Hydroxy: 28 ng/mL — AB (ref 30.0–100.0)

## 2024-04-12 MED ORDER — VITAMIN D (ERGOCALCIFEROL) 1.25 MG (50000 UNIT) PO CAPS
50000.0000 [IU] | ORAL_CAPSULE | ORAL | 0 refills | Status: AC
  Filled 2024-04-12: qty 12, 84d supply, fill #0

## 2024-04-18 ENCOUNTER — Ambulatory Visit (INDEPENDENT_AMBULATORY_CARE_PROVIDER_SITE_OTHER)

## 2024-04-18 DIAGNOSIS — J309 Allergic rhinitis, unspecified: Secondary | ICD-10-CM

## 2024-05-01 ENCOUNTER — Ambulatory Visit

## 2024-05-01 DIAGNOSIS — J309 Allergic rhinitis, unspecified: Secondary | ICD-10-CM | POA: Diagnosis not present

## 2024-05-03 ENCOUNTER — Encounter: Payer: Self-pay | Admitting: Obstetrics and Gynecology

## 2024-05-03 ENCOUNTER — Other Ambulatory Visit (HOSPITAL_COMMUNITY)
Admission: RE | Admit: 2024-05-03 | Discharge: 2024-05-03 | Disposition: A | Discharge status: Home / Self Care | Source: Ambulatory Visit | Attending: Obstetrics and Gynecology | Admitting: Obstetrics and Gynecology

## 2024-05-03 ENCOUNTER — Ambulatory Visit (INDEPENDENT_AMBULATORY_CARE_PROVIDER_SITE_OTHER): Admitting: Obstetrics and Gynecology

## 2024-05-03 ENCOUNTER — Encounter: Payer: Self-pay | Admitting: *Deleted

## 2024-05-03 VITALS — BP 102/67 | HR 68 | Ht 62.5 in | Wt 162.0 lb

## 2024-05-03 DIAGNOSIS — Z1151 Encounter for screening for human papillomavirus (HPV): Secondary | ICD-10-CM | POA: Insufficient documentation

## 2024-05-03 DIAGNOSIS — Z124 Encounter for screening for malignant neoplasm of cervix: Secondary | ICD-10-CM | POA: Insufficient documentation

## 2024-05-03 DIAGNOSIS — Z01419 Encounter for gynecological examination (general) (routine) without abnormal findings: Secondary | ICD-10-CM | POA: Insufficient documentation

## 2024-05-03 NOTE — Progress Notes (Signed)
 Pt is in office for routine gyn exam, last pap was HPV+.

## 2024-05-03 NOTE — Progress Notes (Signed)
 Subjective:     Amanda Blackburn is a 23 y.o. female P0 with LMP 04/30/24 and BMI 29 who is here for a comprehensive physical exam. The patient reports no problems. She has been abstinent over the past year. She is in a monogamous relationship and planning an engagement soon. Patient is without any complaints. She reports a monthly period. She denies pelvic pain and abnormal discharge. 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 Patient is without any other complaints  Past Medical History:  Diagnosis Date   Allergy     Anxiety    Asthma    Depression    sees therapy    Eczema    Frequent headaches    Seasonal allergies    Past Surgical History:  Procedure Laterality Date   NO PAST SURGERIES     Family History  Problem Relation Age of Onset   Asthma Mother    Diabetes Mother    Hypertension Father    Asthma Sister     Social History   Socioeconomic History   Marital status: Single    Spouse name: Not on file   Number of children: Not on file   Years of education: Not on file   Highest education level: Not on file  Occupational History   Not on file  Tobacco Use   Smoking status: Never   Smokeless tobacco: Never  Vaping Use   Vaping status: Never Used  Substance and Sexual Activity   Alcohol use: No   Drug use: No   Sexual activity: Not Currently    Birth control/protection: None  Other Topics Concern   Not on file  Social History Narrative   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 , masters [place]  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   Single as of 03/02/2019    No guns    Wears seat belt   Safe in relationship    No etoh or drugs    She wants to be a energy manager   Works [employer] in [employer]   Social Drivers of Health   Financial Resource Strain: Not on file  Food Insecurity: No Food Insecurity (04/11/2024)   Hunger Vital Sign    Worried About Running Out of Food in the Last Year: Never true    Ran Out of Food in the Last Year: Never true   Transportation Needs: No Transportation Needs (04/11/2024)   PRAPARE - Administrator, Civil Service (Medical): No    Lack of Transportation (Non-Medical): No  Physical Activity: Sufficiently Active (04/11/2024)   Exercise Vital Sign    Days of Exercise per Week: 4 days    Minutes of Exercise per Session: 80 min  Stress: Not on file  Social Connections: Unknown (11/11/2021)   Received [facility]   Social Network    Social Network: Not on file  Intimate Partner Violence: Not At Risk (04/11/2024)   Humiliation, Afraid, Rape, and Kick questionnaire    Fear of Current or Ex-Partner: No    Emotionally Abused: No    Physically Abused: No    Sexually Abused: No   Health Maintenance  Topic Date Due   COVID-19 Vaccine (2 - 2025-26 season) 02/28/2024   CHLAMYDIA SCREENING  05/02/2024   Influenza Vaccine  09/26/2024 (Originally 01/28/2024)   Pneumococcal Vaccine (1 of 2 - PCV) 04/11/2025 (Originally 07/31/2018)   Meningococcal B Vaccine (2 of 2 - Bexsero SCDM 2-dose series) 04/11/2025 (Originally 07/17/2018)   Cervical  Cancer Screening (Pap smear)  05/02/2026   DTaP/Tdap/Td (8 - Td or Tdap) 11/19/2031   Hepatitis B Vaccines 19-59 Average Risk  Completed   HPV VACCINES  Completed   Hepatitis C Screening  Completed   HIV Screening  Completed       Review of Systems Pertinent items noted in HPI and remainder of comprehensive ROS otherwise negative.   Objective:  Blood pressure 102/67, pulse 68, height 5' 2.5 (1.588 m), weight 162 lb (73.5 kg), last menstrual period 04/30/2024.   GENERAL: Well-developed, well-nourished female in no acute distress.  HEENT: Normocephalic, atraumatic. Sclerae anicteric.  NECK: Supple. Normal thyroid.  LUNGS: Clear to auscultation bilaterally.  HEART: Regular rate and rhythm. BREASTS: Symmetric in size. No palpable masses or lymphadenopathy, skin changes, or nipple drainage. ABDOMEN: Soft, nontender, nondistended. No  organomegaly. PELVIC: Normal external female genitalia. Vagina is pink and rugated.  Normal discharge. Normal appearing cervix. Uterus is normal in size. No adnexal mass or tenderness. Chaperone present during the pelvic exam EXTREMITIES: No cyanosis, clubbing, or edema, 2+ distal pulses.     Assessment:    Healthy female exam.      Plan:    Pap smear collected STI screening per patient request Patient will be contacted with abnormal results Patient has been abstinent and declines contraception See After Visit Summary for Counseling Recommendations

## 2024-05-04 LAB — HIV ANTIBODY (ROUTINE TESTING W REFLEX): HIV Screen 4th Generation wRfx: NONREACTIVE

## 2024-05-04 LAB — RPR: RPR Ser Ql: NONREACTIVE

## 2024-05-04 LAB — HEPATITIS C ANTIBODY: Hep C Virus Ab: NONREACTIVE

## 2024-05-04 LAB — HEPATITIS B SURFACE ANTIGEN: Hepatitis B Surface Ag: NEGATIVE

## 2024-05-05 ENCOUNTER — Ambulatory Visit: Payer: Self-pay | Admitting: Obstetrics and Gynecology

## 2024-05-05 LAB — CYTOLOGY - PAP
Comment: NEGATIVE
Diagnosis: NEGATIVE
Diagnosis: REACTIVE
High risk HPV: NEGATIVE

## 2024-05-11 ENCOUNTER — Ambulatory Visit (INDEPENDENT_AMBULATORY_CARE_PROVIDER_SITE_OTHER)

## 2024-05-11 DIAGNOSIS — J309 Allergic rhinitis, unspecified: Secondary | ICD-10-CM

## 2024-05-22 ENCOUNTER — Encounter: Admitting: Family Medicine

## 2024-05-23 ENCOUNTER — Ambulatory Visit

## 2024-05-23 DIAGNOSIS — J309 Allergic rhinitis, unspecified: Secondary | ICD-10-CM | POA: Diagnosis not present

## 2024-06-08 DIAGNOSIS — J301 Allergic rhinitis due to pollen: Secondary | ICD-10-CM | POA: Diagnosis not present

## 2024-06-08 DIAGNOSIS — J302 Other seasonal allergic rhinitis: Secondary | ICD-10-CM | POA: Diagnosis not present

## 2024-06-08 DIAGNOSIS — J3089 Other allergic rhinitis: Secondary | ICD-10-CM | POA: Diagnosis not present

## 2024-06-08 DIAGNOSIS — J3081 Allergic rhinitis due to animal (cat) (dog) hair and dander: Secondary | ICD-10-CM | POA: Diagnosis not present

## 2024-06-08 NOTE — Progress Notes (Signed)
 VIALS MADE ON 06/08/24

## 2024-06-09 ENCOUNTER — Ambulatory Visit

## 2024-06-09 ENCOUNTER — Ambulatory Visit: Admitting: Internal Medicine

## 2024-06-09 DIAGNOSIS — J309 Allergic rhinitis, unspecified: Secondary | ICD-10-CM | POA: Diagnosis not present

## 2024-06-26 ENCOUNTER — Other Ambulatory Visit (HOSPITAL_COMMUNITY): Payer: Self-pay

## 2024-06-28 ENCOUNTER — Ambulatory Visit (INDEPENDENT_AMBULATORY_CARE_PROVIDER_SITE_OTHER)

## 2024-06-28 DIAGNOSIS — J309 Allergic rhinitis, unspecified: Secondary | ICD-10-CM | POA: Diagnosis not present

## 2024-07-04 DIAGNOSIS — J309 Allergic rhinitis, unspecified: Secondary | ICD-10-CM | POA: Diagnosis not present

## 2024-07-07 ENCOUNTER — Ambulatory Visit: Admitting: Internal Medicine

## 2024-07-07 ENCOUNTER — Encounter: Payer: Self-pay | Admitting: Internal Medicine

## 2024-07-07 ENCOUNTER — Other Ambulatory Visit: Payer: Self-pay

## 2024-07-07 ENCOUNTER — Other Ambulatory Visit (HOSPITAL_COMMUNITY): Payer: Self-pay

## 2024-07-07 VITALS — BP 102/70 | HR 74 | Temp 99.1°F | Resp 16 | Ht 62.0 in | Wt 160.6 lb

## 2024-07-07 DIAGNOSIS — H1045 Other chronic allergic conjunctivitis: Secondary | ICD-10-CM | POA: Diagnosis not present

## 2024-07-07 DIAGNOSIS — J3089 Other allergic rhinitis: Secondary | ICD-10-CM

## 2024-07-07 DIAGNOSIS — J4520 Mild intermittent asthma, uncomplicated: Secondary | ICD-10-CM | POA: Diagnosis not present

## 2024-07-07 DIAGNOSIS — J302 Other seasonal allergic rhinitis: Secondary | ICD-10-CM

## 2024-07-07 DIAGNOSIS — T7819XD Other adverse food reactions, not elsewhere classified, subsequent encounter: Secondary | ICD-10-CM | POA: Diagnosis not present

## 2024-07-07 DIAGNOSIS — L2389 Allergic contact dermatitis due to other agents: Secondary | ICD-10-CM | POA: Diagnosis not present

## 2024-07-07 MED ORDER — CETIRIZINE HCL 10 MG PO TABS
10.0000 mg | ORAL_TABLET | Freq: Every day | ORAL | 1 refills | Status: AC
  Filled 2024-07-07: qty 90, 90d supply, fill #0

## 2024-07-07 MED ORDER — ALBUTEROL SULFATE HFA 108 (90 BASE) MCG/ACT IN AERS
2.0000 | INHALATION_SPRAY | RESPIRATORY_TRACT | 1 refills | Status: AC | PRN
  Filled 2024-07-07: qty 6.7, 20d supply, fill #0

## 2024-07-07 NOTE — Patient Instructions (Addendum)
 Dermatitis/ eczema, improved  - Use a gentle, unscented cleanser at the end of the bath (such as Dove unscented bar or baby wash, or Aveeno sensitive body wash). Then rinse, pat half-way dry, and apply a gentle, unscented moisturizer cream or ointment (Cerave, Cetaphil, Eucerin, Aveeno, Aquaphor, Vanicream, Vaseline)  all over while still damp. Dry skin makes the itching and rash worse. The skin should be moisturized with a gentle, unscented moisturizer at least twice daily.  - Use only unscented liquid laundry detergent. - Apply prescribed topical steroid (triamcinolone  0.1% below neck or hydrocortisone  2.5% above neck) to flared areas (red and thickened eczema) after the moisturizer has soaked into the skin (wait at least 30 minutes). Taper off the topical steroids as the skin improves. Do not use topical steroid for more than 7-10 days at a time.  - Put  Elidel  1% onto areas of rough eczema twice a day. May decrease to once a day as the eczema improves. This will not thin the skin, and is safe for chronic use. Do not put this onto normal appearing skin.   Mild intermittent asthma  Breathing test: Looked great - Maintenance inhaler: continue Singulair  10mg  daily  - Rescue inhaler: Albuterol  2 puffs every 4-6 hours as needed for respiratory symptoms of shortness of breath, or wheezing Asthma control goals:  Full participation in all desired activities (may need albuterol  before activity) Albuterol  use two times or less a week on average (not counting use with activity) Cough interfering with sleep two times or less a month Oral steroids no more than once a year No hospitalizations   Seasonal and perennial allergic rhinitis (grasses, weeds, trees, ragweed, mold, dust mite, cat)  - Continue  Flonase  2 sprays per nostril daily.  Aim upward and outward.   - Continue  Zyrtec  10mg  daily. - Continue Singulair  10mg  daily.  - Continue  allergy  injections.  Bring Epipen     We are almost to  maintenance!!!!!  Pollen-food allergy , this should improve once you get on Maintenance for allergy  injections  - These symptoms are typically not life-threatening and are because of a cross reaction between a pollen you are allergic to, and to a protein in specific foods (such as fresh fruits, vegetables, and nuts). - If you can eat these things and tolerate the symptoms, it is fine to continue to do so.  If not, you may avoid these fresh fruits and vegetables.   - Heating these foods, buying them canned, and peeling these foods should allow them to be consumed without symptoms or with less symptoms. - Patients typically report itching and/or mild swelling of the mouth and throat immediately following ingestion of certain uncooked fruits (including nuts) or raw vegetables.  - Only a very small number of affected individuals experience systemic allergic reactions, such as anaphylaxis which occurs with true food allergies.   - for SKIN only reaction, okay to take Benadryl 25mg  capsules every 6 hours as needed - for SKIN + ANY additional symptoms, OR IF concern for LIFE THREATENING reaction = Epipen  Autoinjector EpiPen  0.3 mg. - If using Epinephrine  autoinjector, call 911 or go to the ER.    Follow up: in 6 months  Congratulations on the engagements

## 2024-07-07 NOTE — Progress Notes (Addendum)
 "  FOLLOW UP Date of Service/Encounter:  07/07/2024  Subjective:  Amanda Blackburn (DOB: Sep 21, 2000) is a 24 y.o. female who [facility] in re-evaluation of the following: asthma, rhinitis on AIT, OAS, dermatitis  History obtained from: chart review and patient.  For Review, LV was on 12/17/23  with Dr. Lorin seen for routine follow-up. See below for summary of history and diagnostics.   Therapeutic plans/changes recommended: Restarted allergy  medications and allergy  injections, recommended extended patch testing for dermatitis. ----------------------------------------------------- Pertinent History/Diagnostics:  - Asthma: intermittent              - normal  spirometry (02/04/22): ratio 71%, 81%, 2.33L FEV1 (pre),    -  - Allergic Rhinitis:              - SPT environmental panel (04/01/2019): Positive to grass, weeds, trees, molds, cat, mite             - On AIT, restarted 01/2022, vial 1 (G-T-W-C); vial 2 (M-DM) - PFAS              - Hx of reaction: oral pruritus with fresh fruits and vegetables    - Lip  Dermatitis              - 11/223: negative True Test              - controlled with elidel /hydrocortisone  as needed              - Followed by dermatology   Today presents for follow-up. Discussed the use of AI scribe software for clinical note transcription with the patient, who gave verbal consent to proceed.  History of Present Illness Amanda Blackburn is a 24 year old female with asthma, eczema, and allergies who presents for follow-up on allergy  shots.  Allergic rhinitis and immunotherapy - Nearing the four-week mark of allergy  shots. - Consistent with immunotherapy regimen. - Expresses satisfaction and pride in progress. - Excited to be close to completing this phase. - Using zyrtec  and singulair  daily. She has not needed flonase  recently   Asthma symptoms and control - Asthma is well-controlled. - Requires albuterol  inhaler only during  intense physical activity, such as running on a treadmill. - Currently using an elliptical for exercise due to a knee injury sustained in September. - Knee injury required brace for two and a half months.  Eczematous skin changes - Eczema is stable with no significant issues. - Has not used lotion for years and is considering finding a suitable one. - Uses Vanicream as a skin cleanser.   Oral Allergy   - Has not tried reintroducing any trigger foods yet.  NO reactions or accidental ingestions   Recently got engaged!   All medications reviewed by clinical staff and updated in chart. No new pertinent medical or surgical history except as noted in HPI.  ROS: All others negative except as noted per HPI.   Objective:  BP 102/70 (BP Location: Left Arm, Patient Position: Sitting, Cuff Size: Normal)   Pulse 74   Temp 99.1 F (37.3 C) (Temporal)   Resp 16   Ht 5' 2 (1.575 m)   Wt 160 lb 9.6 oz (72.8 kg)   SpO2 98%   BMI 29.37 kg/m  Body mass index is 29.37 kg/m. Physical Exam: General Appearance:  Alert, cooperative, no distress, appears stated age  Head:  Normocephalic, without obvious abnormality, atraumatic  Eyes:  Conjunctiva clear, EOM's intact  Ears EACs  normal bilaterally  Nose: Nares normal, hypertrophic turbinates, normal mucosa, no visible anterior polyps, and septum midline  Throat: Lips, tongue normal; teeth and gums normal, + cobblestoning  Neck: Supple, symmetrical  Lungs:   clear to auscultation bilaterally, Respirations unlabored, no coughing  Heart:  regular rate and rhythm and no murmur, Appears well perfused  Extremities: No edema  Skin: Skin color, texture, turgor normal and no rashes or lesions on visualized portions of skin  Neurologic: No gross deficits   Labs:  Lab Orders  No laboratory test(s) ordered today    Spirometry:  Tracings reviewed. Her effort: Good reproducible efforts. FVC: 3.39 L FEV1: 2.61L, 95% predicted FEV1/FVC ratio:  77% Interpretation: Spirometry consistent with normal pattern.  Please see scanned spirometry results for details.    Assessment/Plan  Pollen-food allergy , subsequent encounter  Mild intermittent asthma without complication - Plan: albuterol  (PROAIR  HFA) 108 (90 Base) MCG/ACT inhaler, Spirometry with Graph  Seasonal and perennial allergic rhinitis - Plan: cetirizine  (ZYRTEC  ALLERGY ) 10 MG tablet  Other chronic allergic conjunctivitis of both eyes  Allergic contact dermatitis due to other agents  Patient Instructions  Dermatitis/ eczema, improved  - Use a gentle, unscented cleanser at the end of the bath (such as Dove unscented bar or baby wash, or Aveeno sensitive body wash). Then rinse, pat half-way dry, and apply a gentle, unscented moisturizer cream or ointment (Cerave, Cetaphil, Eucerin, Aveeno, Aquaphor, Vanicream, Vaseline)  all over while still damp. Dry skin makes the itching and rash worse. The skin should be moisturized with a gentle, unscented moisturizer at least twice daily.  - Use only unscented liquid laundry detergent. - Apply prescribed topical steroid (triamcinolone  0.1% below neck or hydrocortisone  2.5% above neck) to flared areas (red and thickened eczema) after the moisturizer has soaked into the skin (wait at least 30 minutes). Taper off the topical steroids as the skin improves. Do not use topical steroid for more than 7-10 days at a time.  - Put  Elidel  1% onto areas of rough eczema twice a day. May decrease to once a day as the eczema improves. This will not thin the skin, and is safe for chronic use. Do not put this onto normal appearing skin.   Mild intermittent asthma  Breathing test: Looked great - Maintenance inhaler: continue Singulair  10mg  daily  - Rescue inhaler: Albuterol  2 puffs every 4-6 hours as needed for respiratory symptoms of shortness of breath, or wheezing Asthma control goals:  Full participation in all desired activities (may need albuterol   before activity) Albuterol  use two times or less a week on average (not counting use with activity) Cough interfering with sleep two times or less a month Oral steroids no more than once a year No hospitalizations   Seasonal and perennial allergic rhinitis (grasses, weeds, trees, ragweed, mold, dust mite, cat)  - Continue  Flonase  2 sprays per nostril daily.  Aim upward and outward.   - Continue  Zyrtec  10mg  daily. - Continue Singulair  10mg  daily.  - Continue  allergy  injections.  Bring Epipen     We are almost to maintenance!!!!!  Pollen-food allergy , this should improve once you get on Maintenance for allergy  injections  - These symptoms are typically not life-threatening and are because of a cross reaction between a pollen you are allergic to, and to a protein in specific foods (such as fresh fruits, vegetables, and nuts). - If you can eat these things and tolerate the symptoms, it is fine to continue to do so.  If  not, you may avoid these fresh fruits and vegetables.   - Heating these foods, buying them canned, and peeling these foods should allow them to be consumed without symptoms or with less symptoms. - Patients typically report itching and/or mild swelling of the mouth and throat immediately following ingestion of certain uncooked fruits (including nuts) or raw vegetables.  - Only a very small number of affected individuals experience systemic allergic reactions, such as anaphylaxis which occurs with true food allergies.   - for SKIN only reaction, okay to take Benadryl 25mg  capsules every 6 hours as needed - for SKIN + ANY additional symptoms, OR IF concern for LIFE THREATENING reaction = Epipen  Autoinjector EpiPen  0.3 mg. - If using Epinephrine  autoinjector, call 911 or go to the ER.    Follow up: in 6 months  Congratulations on the engagements    Other: allergy  injection given in clinic today  Thank you so much for letting me partake in your care today.  Don't hesitate to  reach out if you have any additional concerns!  Hargis Springer, MD  [facility] [place], [place]        "

## 2024-07-12 ENCOUNTER — Ambulatory Visit: Payer: Self-pay | Admitting: Nurse Practitioner

## 2024-07-12 ENCOUNTER — Ambulatory Visit (INDEPENDENT_AMBULATORY_CARE_PROVIDER_SITE_OTHER)

## 2024-07-12 DIAGNOSIS — J302 Other seasonal allergic rhinitis: Secondary | ICD-10-CM

## 2024-07-14 ENCOUNTER — Other Ambulatory Visit (HOSPITAL_COMMUNITY): Payer: Self-pay

## 2024-07-23 ENCOUNTER — Other Ambulatory Visit (HOSPITAL_COMMUNITY): Payer: Self-pay

## 2024-12-27 ENCOUNTER — Encounter: Admitting: Family Medicine

## 2025-01-05 ENCOUNTER — Ambulatory Visit: Admitting: Internal Medicine
